# Patient Record
Sex: Female | Born: 1990 | Race: White | Hispanic: No | Marital: Single | Smoking: Current every day smoker
Health system: Southern US, Community
[De-identification: ages and names within clinical notes are randomized; demographics above are authoritative.]

## PROBLEM LIST (undated history)

## (undated) ENCOUNTER — Inpatient Hospital Stay (HOSPITAL_COMMUNITY): Payer: Self-pay

## (undated) DIAGNOSIS — Z8619 Personal history of other infectious and parasitic diseases: Secondary | ICD-10-CM

## (undated) DIAGNOSIS — R569 Unspecified convulsions: Secondary | ICD-10-CM

## (undated) DIAGNOSIS — R011 Cardiac murmur, unspecified: Secondary | ICD-10-CM

## (undated) DIAGNOSIS — N809 Endometriosis, unspecified: Secondary | ICD-10-CM

## (undated) DIAGNOSIS — D649 Anemia, unspecified: Secondary | ICD-10-CM

## (undated) DIAGNOSIS — Z87898 Personal history of other specified conditions: Secondary | ICD-10-CM

## (undated) DIAGNOSIS — K589 Irritable bowel syndrome without diarrhea: Secondary | ICD-10-CM

## (undated) HISTORY — PX: TONSILLECTOMY AND ADENOIDECTOMY: SUR1326

---

## 2008-11-15 ENCOUNTER — Emergency Department (HOSPITAL_COMMUNITY): Admission: EM | Admit: 2008-11-15 | Discharge: 2008-11-16 | Payer: Self-pay | Admitting: Emergency Medicine

## 2008-11-15 IMAGING — US US OB COMP LESS 14 WK
1 series · 14 of 27 positions shown · non-contrast
Comparison: None

CLINICAL DATA: Vaginal bleeding.

OBSTETRIC <14 WK US AND TRANSVAGINAL OB US
TECHNIQUE: Both transabdominal and transvaginal ultrasound
examinations were performed for complete evaluation of the
gestation as well as the maternal uterus, adnexal regions, and
pelvic cul-de-sac.

[Series 1: us ob comp less 14 wk · 0.30mm/px · 14 of 27 slices shown]
[im 1/27]
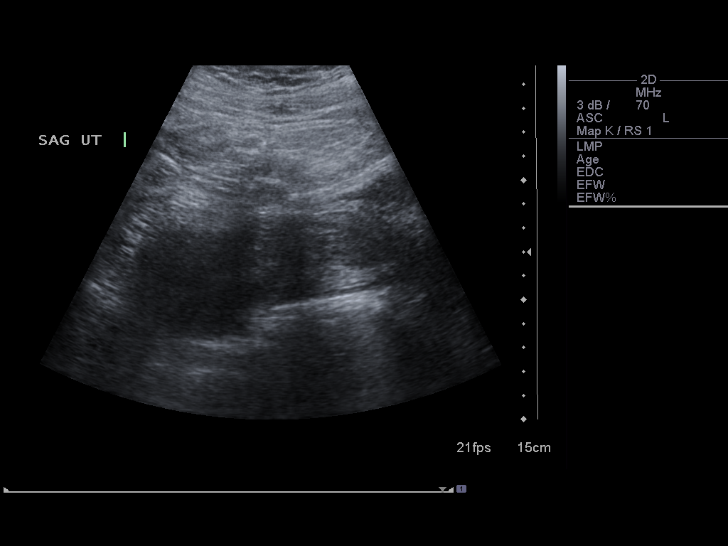
[im 3/27]
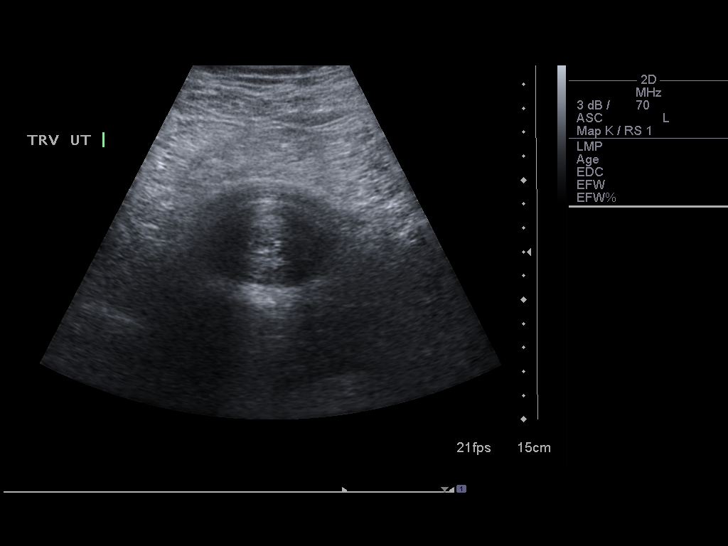
[im 5/27]
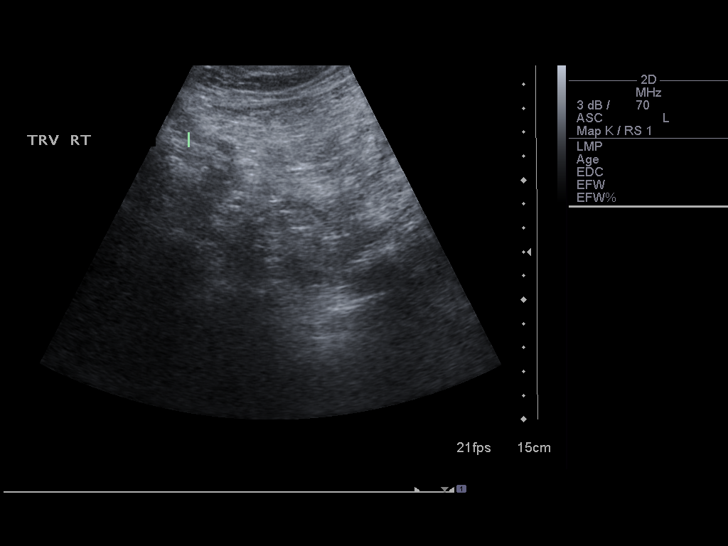
[im 7/27]
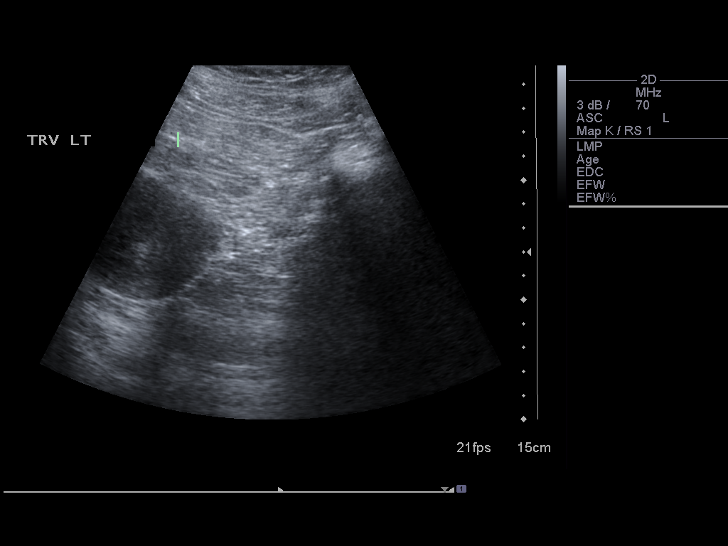
[im 9/27]
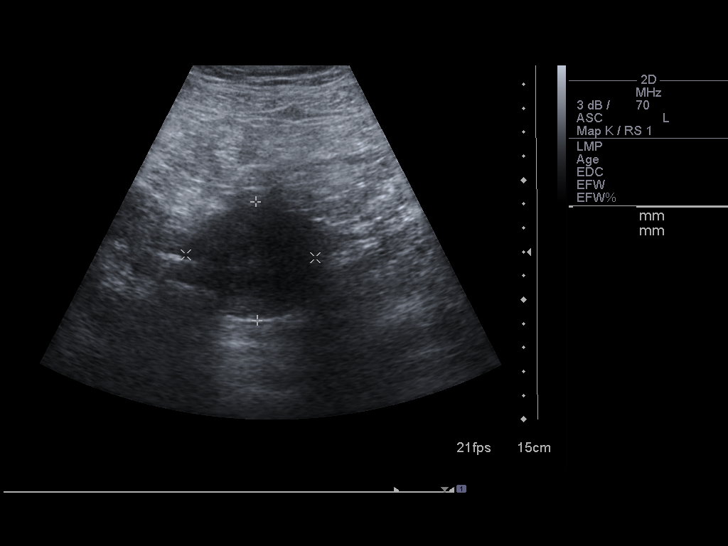
[im 11/27]
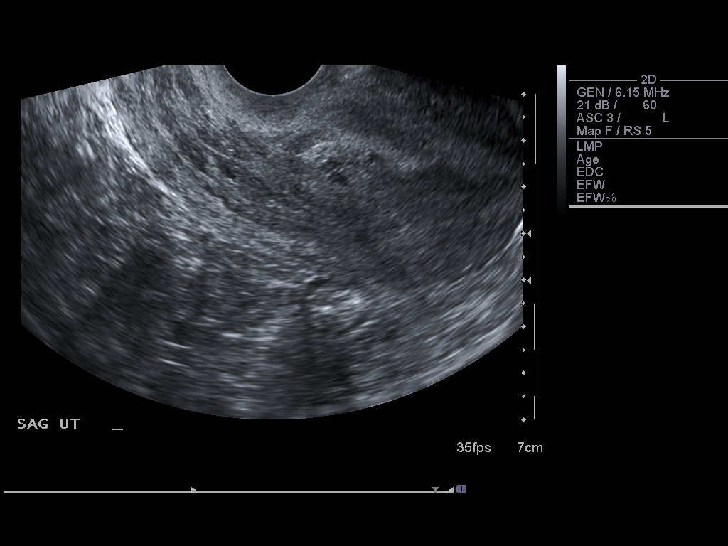
[im 13/27]
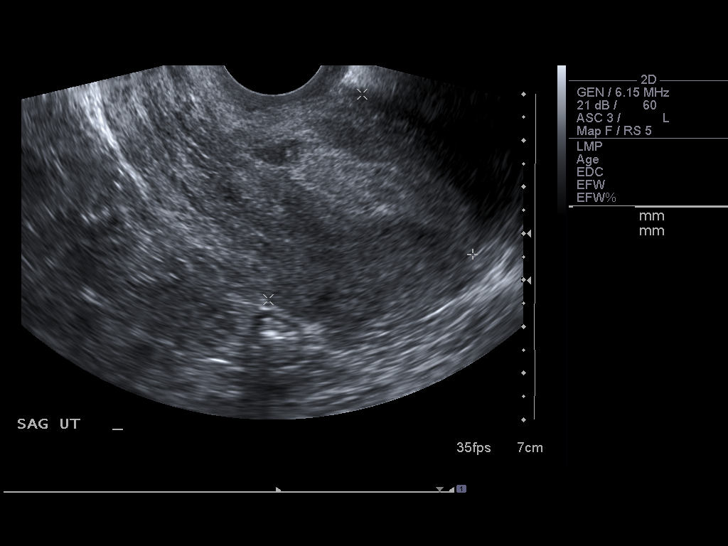
[im 15/27]
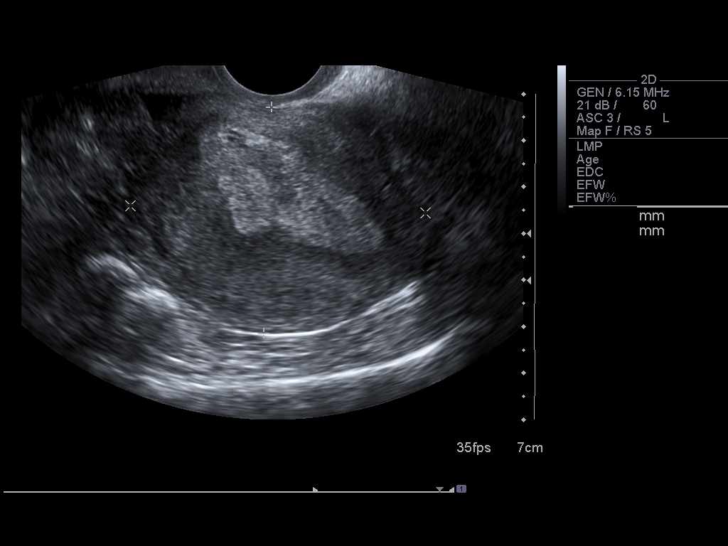
[im 17/27]
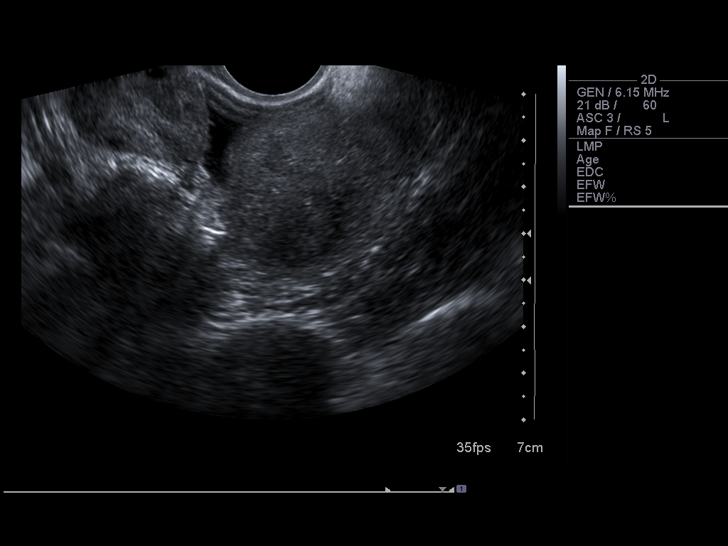
[im 19/27]
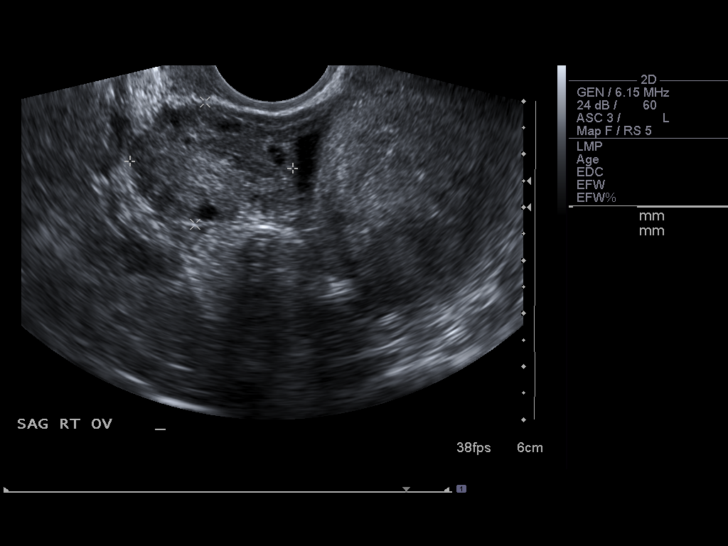
[im 21/27]
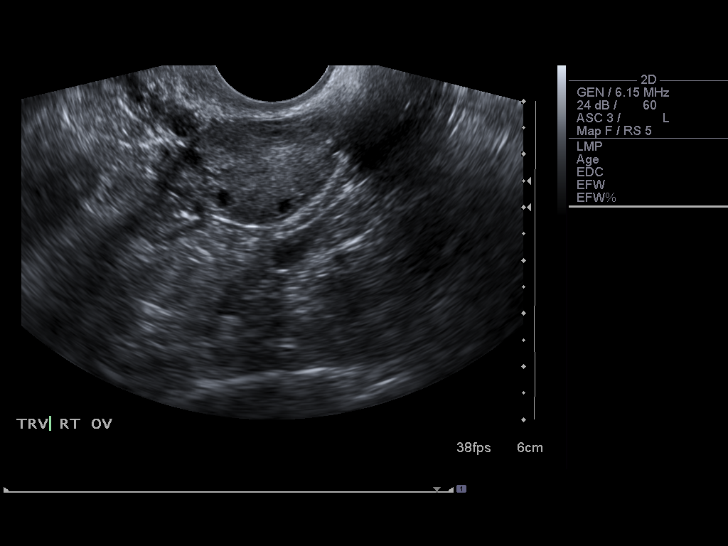
[im 23/27]
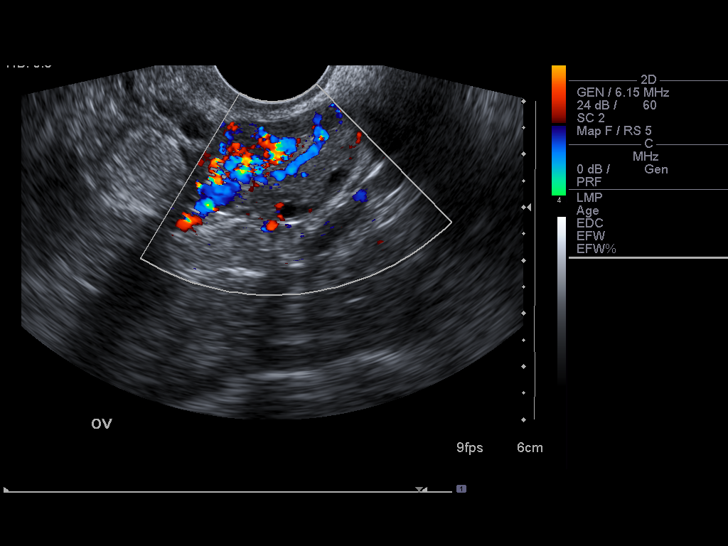
[im 25/27]
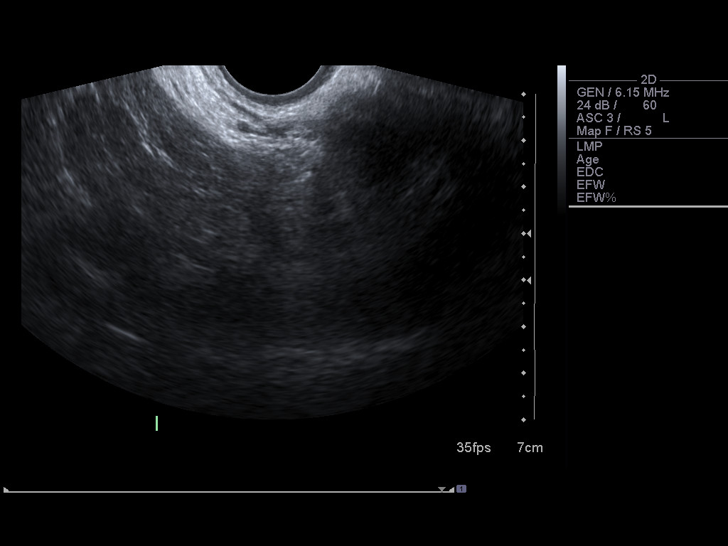
[im 27/27]
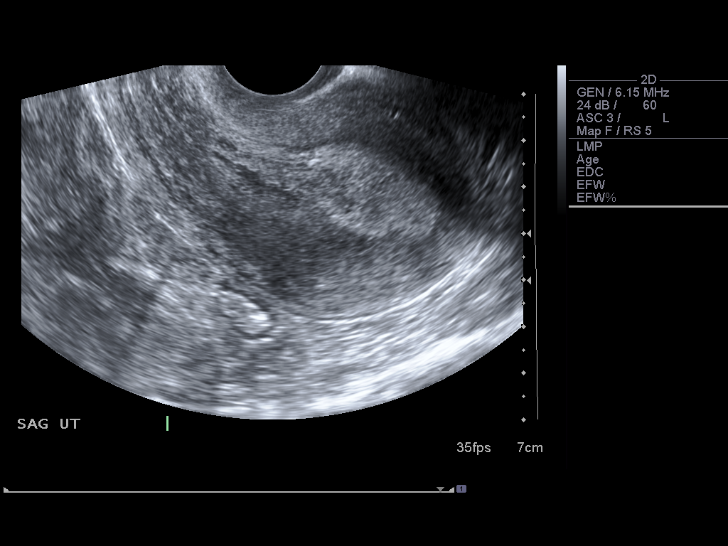

[14 of 27 positions shown; findings below may reference images not displayed]

Intrauterine gestational sac: None
Yolk sac: None
Embryo: None
Cardiac Activity: None
Heart Rate:  bpm

MSD:   mm      w     d
CRL:               w     d          US EDC:

Maternal uterus/adnexae:

The patient is actively bleeding.  Uterus measures 11.3 x 5.4 x
cm.  Echogenic thick endometrium.  No free fluid.

The ovaries are normal bilaterally.
IMPRESSION: 1.  There is no intrauterine pregnancy identified.  The patient is
actively bleeding consistent with a spontaneous AB.
2.  The ovaries are normal.

## 2008-11-16 ENCOUNTER — Encounter: Payer: Self-pay | Admitting: Obstetrics and Gynecology

## 2008-11-16 ENCOUNTER — Inpatient Hospital Stay (HOSPITAL_COMMUNITY): Admission: AD | Admit: 2008-11-16 | Discharge: 2008-11-16 | Payer: Self-pay | Admitting: Obstetrics & Gynecology

## 2008-12-08 ENCOUNTER — Encounter: Payer: Self-pay | Admitting: Advanced Practice Midwife

## 2008-12-08 ENCOUNTER — Ambulatory Visit: Payer: Self-pay | Admitting: Obstetrics & Gynecology

## 2008-12-13 ENCOUNTER — Ambulatory Visit: Payer: Self-pay | Admitting: Obstetrics & Gynecology

## 2008-12-13 ENCOUNTER — Encounter: Payer: Self-pay | Admitting: Obstetrics & Gynecology

## 2008-12-13 LAB — CONVERTED CEMR LAB: hCG, Beta Chain, Quant, S: 11.5 milliintl units/mL

## 2008-12-21 ENCOUNTER — Encounter: Payer: Self-pay | Admitting: Obstetrics & Gynecology

## 2008-12-21 ENCOUNTER — Ambulatory Visit: Payer: Self-pay | Admitting: Obstetrics & Gynecology

## 2008-12-28 ENCOUNTER — Encounter: Payer: Self-pay | Admitting: Obstetrics and Gynecology

## 2008-12-28 ENCOUNTER — Ambulatory Visit: Payer: Self-pay | Admitting: Obstetrics & Gynecology

## 2008-12-28 LAB — CONVERTED CEMR LAB: hCG, Beta Chain, Quant, S: 6 milliintl units/mL

## 2009-03-13 ENCOUNTER — Emergency Department (HOSPITAL_COMMUNITY): Admission: EM | Admit: 2009-03-13 | Discharge: 2009-03-14 | Payer: Self-pay | Admitting: Emergency Medicine

## 2009-03-13 IMAGING — CR DG CHEST 2V
2 series · 2 of 2 positions shown · non-contrast
Comparison: None.

CLINICAL DATA: 18-year-old female with shortness of breath, fever,
congestion.

CHEST - 2 VIEW

[w chest pa]
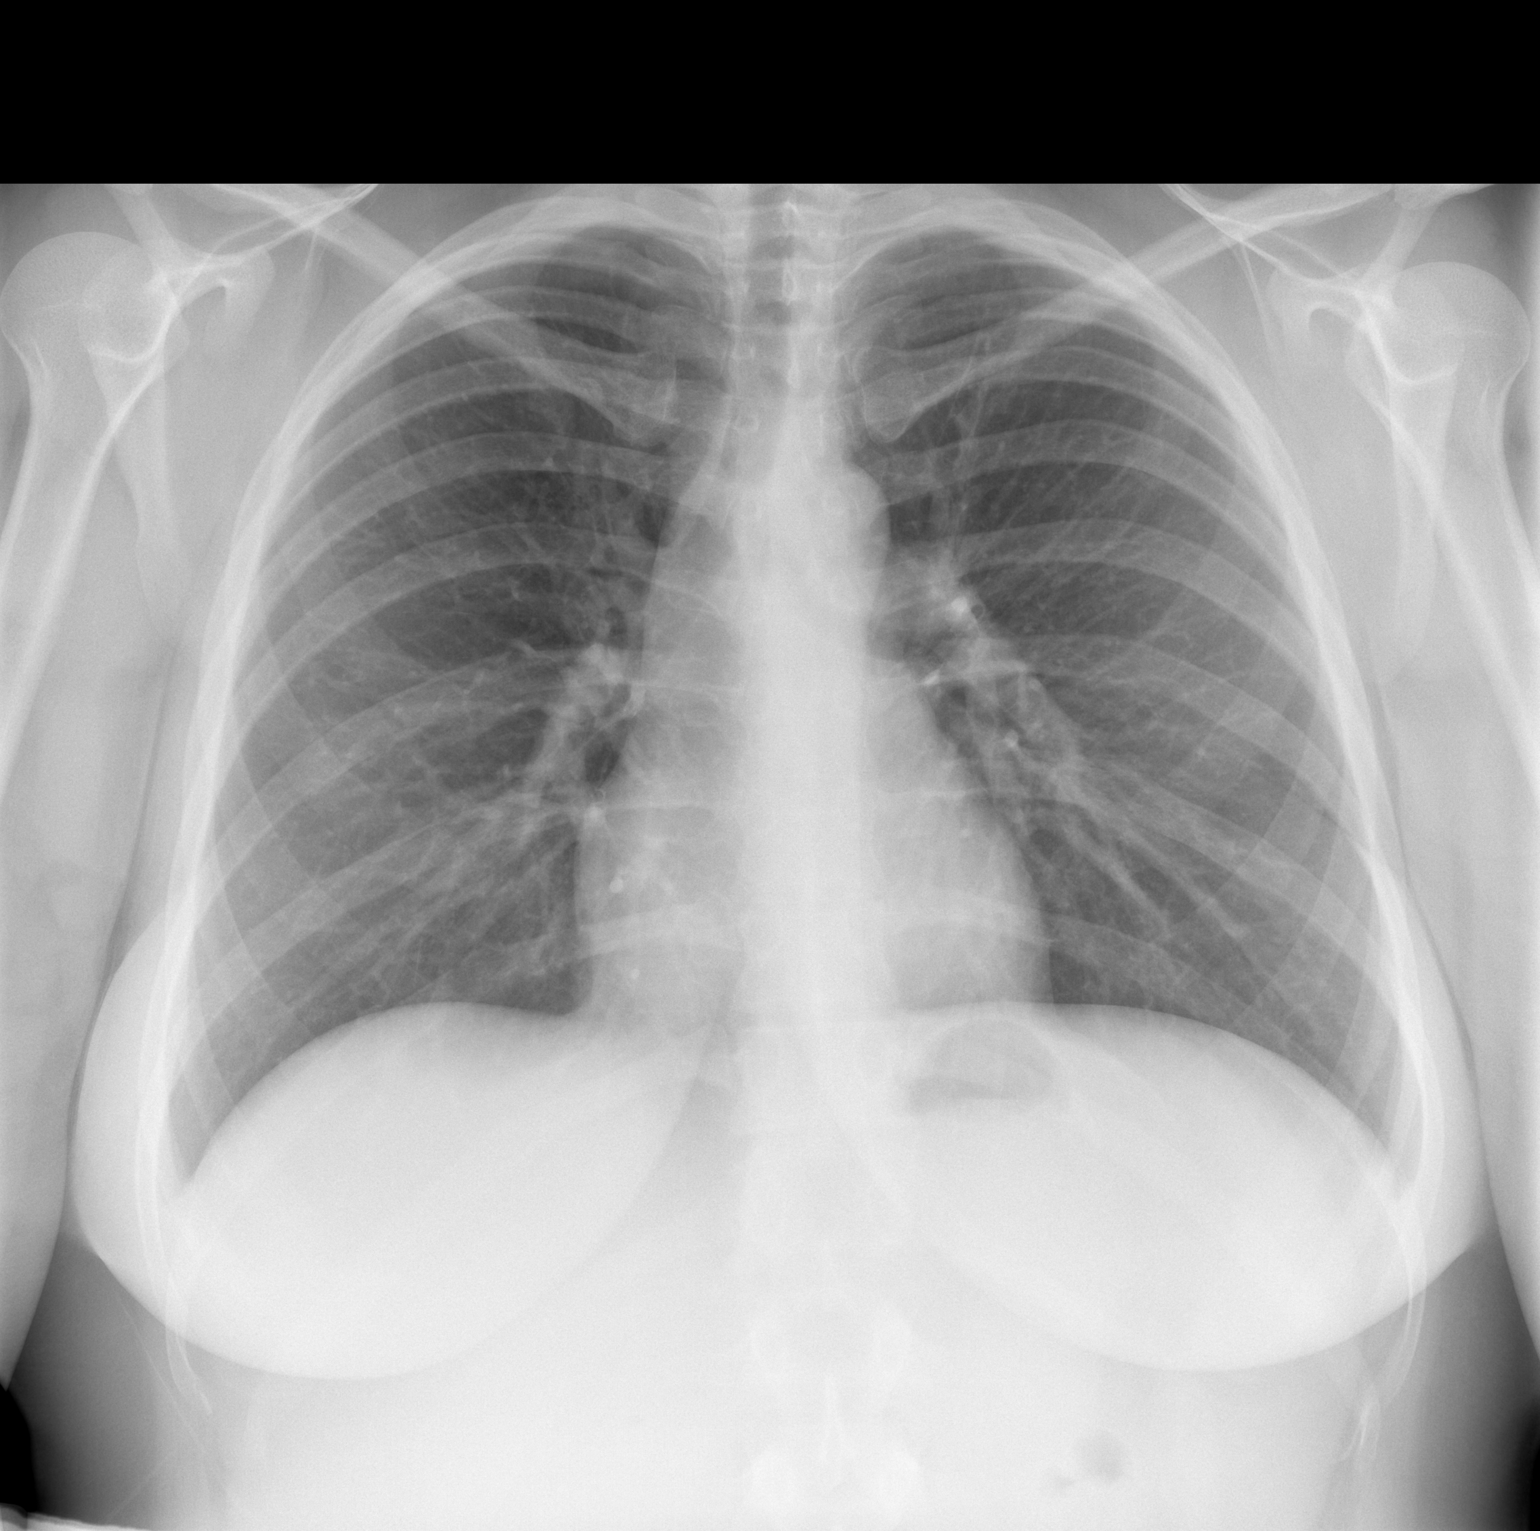

[w chest lat]
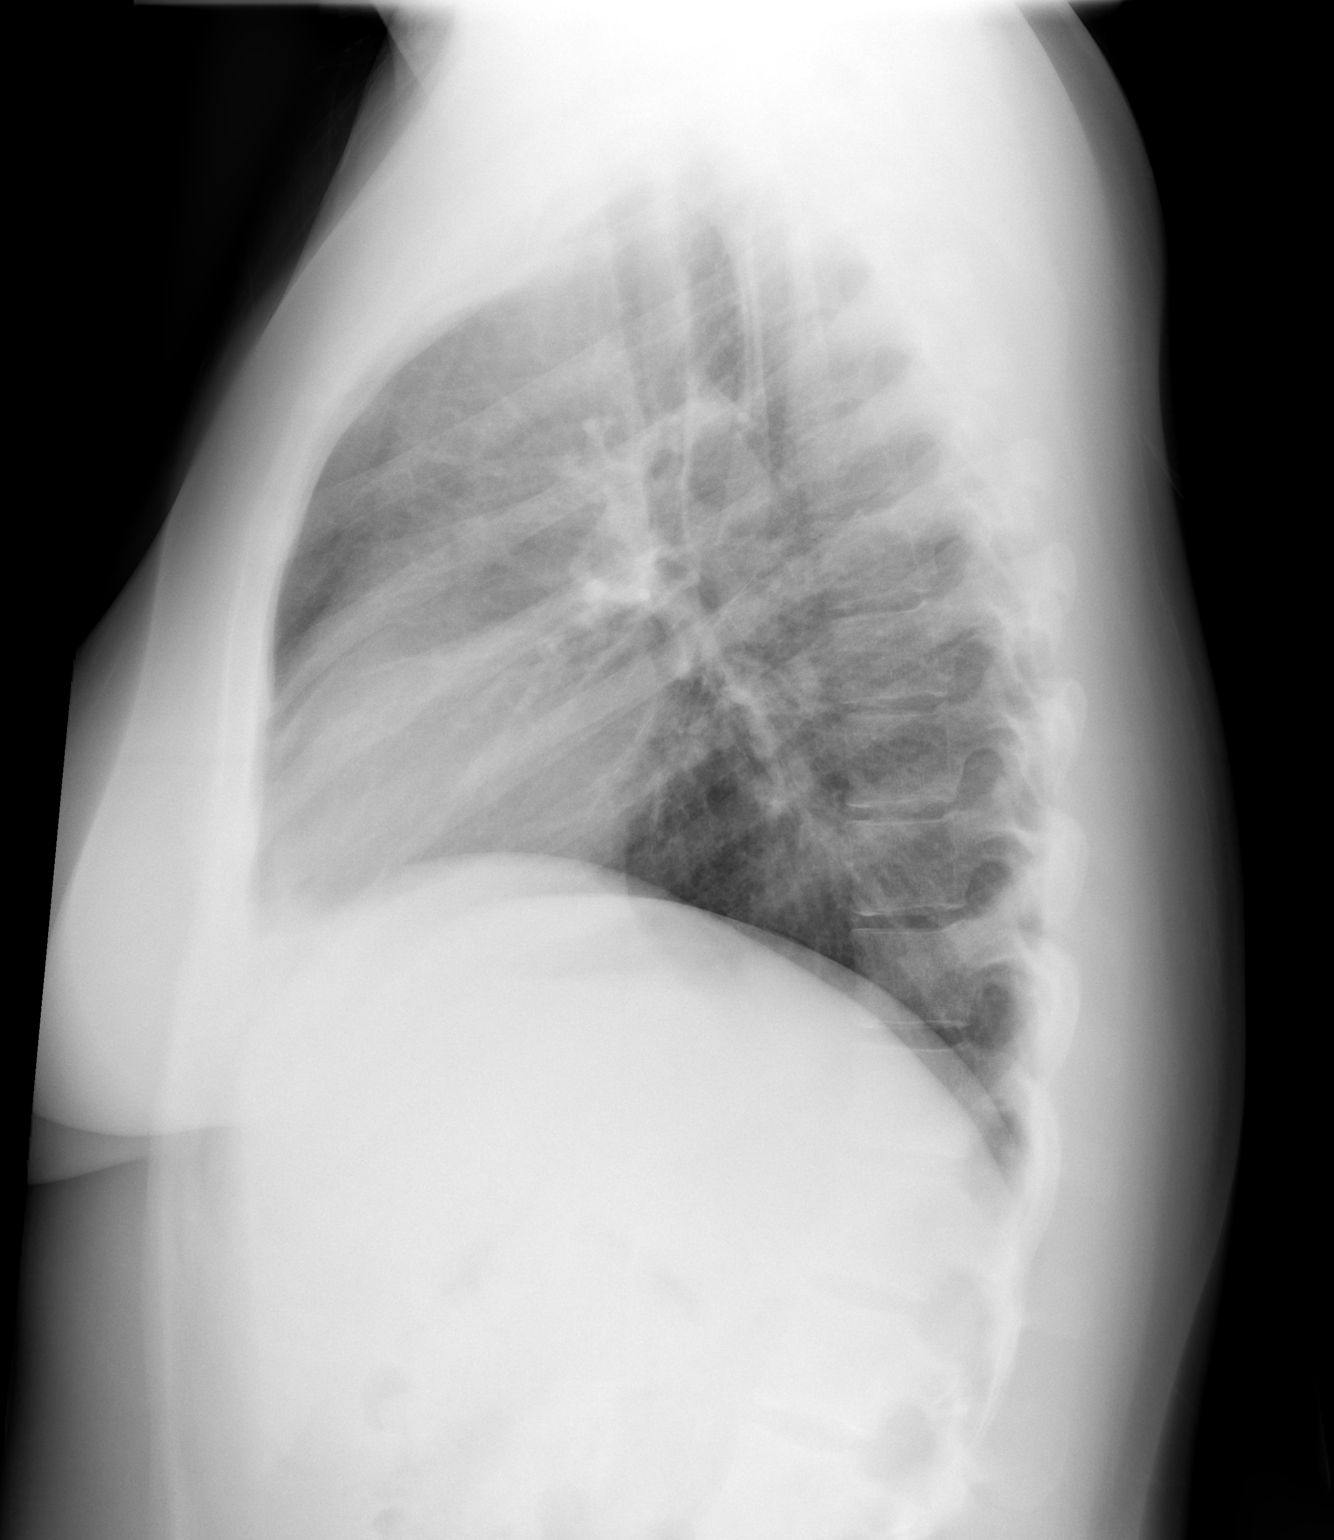

[2 of 2 positions shown; findings below may reference images not displayed]

FINDINGS: Lung volumes within normal limits.  Cardiac size and
mediastinal contours are within normal limits.  Visualized tracheal
air column is within normal limits.  No pneumothorax, pulmonary
edema, pleural effusion, consolidation, or confluent airspace
opacity. No acute osseous abnormality identified.
IMPRESSION: No acute cardiopulmonary abnormality.

## 2009-07-19 ENCOUNTER — Emergency Department (HOSPITAL_COMMUNITY): Admission: EM | Admit: 2009-07-19 | Discharge: 2009-07-19 | Payer: Self-pay | Admitting: Emergency Medicine

## 2009-07-19 IMAGING — CR DG CHEST 2V
2 series · 2 of 2 positions shown · non-contrast
Comparison: [DATE]

CLINICAL DATA: Cough and up blood.  Shortness of breath.  Chest
pain.

CHEST - 2 VIEW

[view not recorded (1 of 2)]
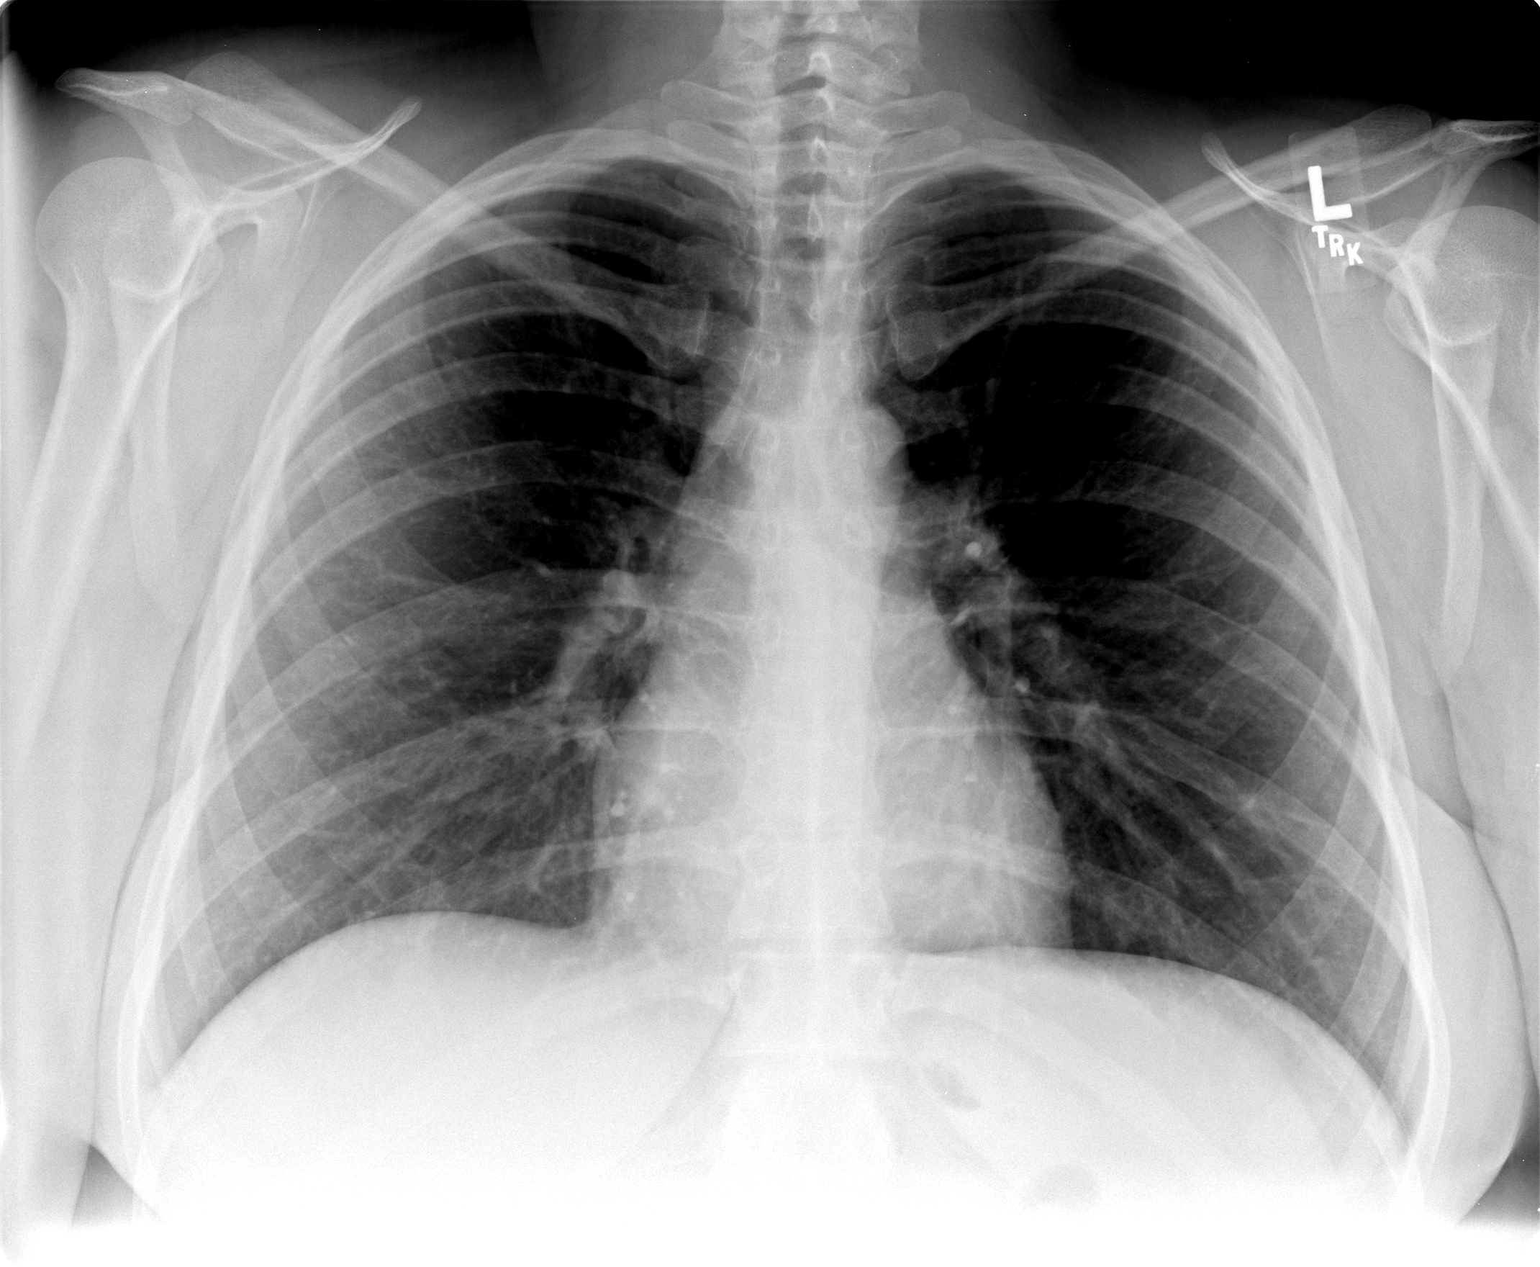

[view not recorded (2 of 2)]
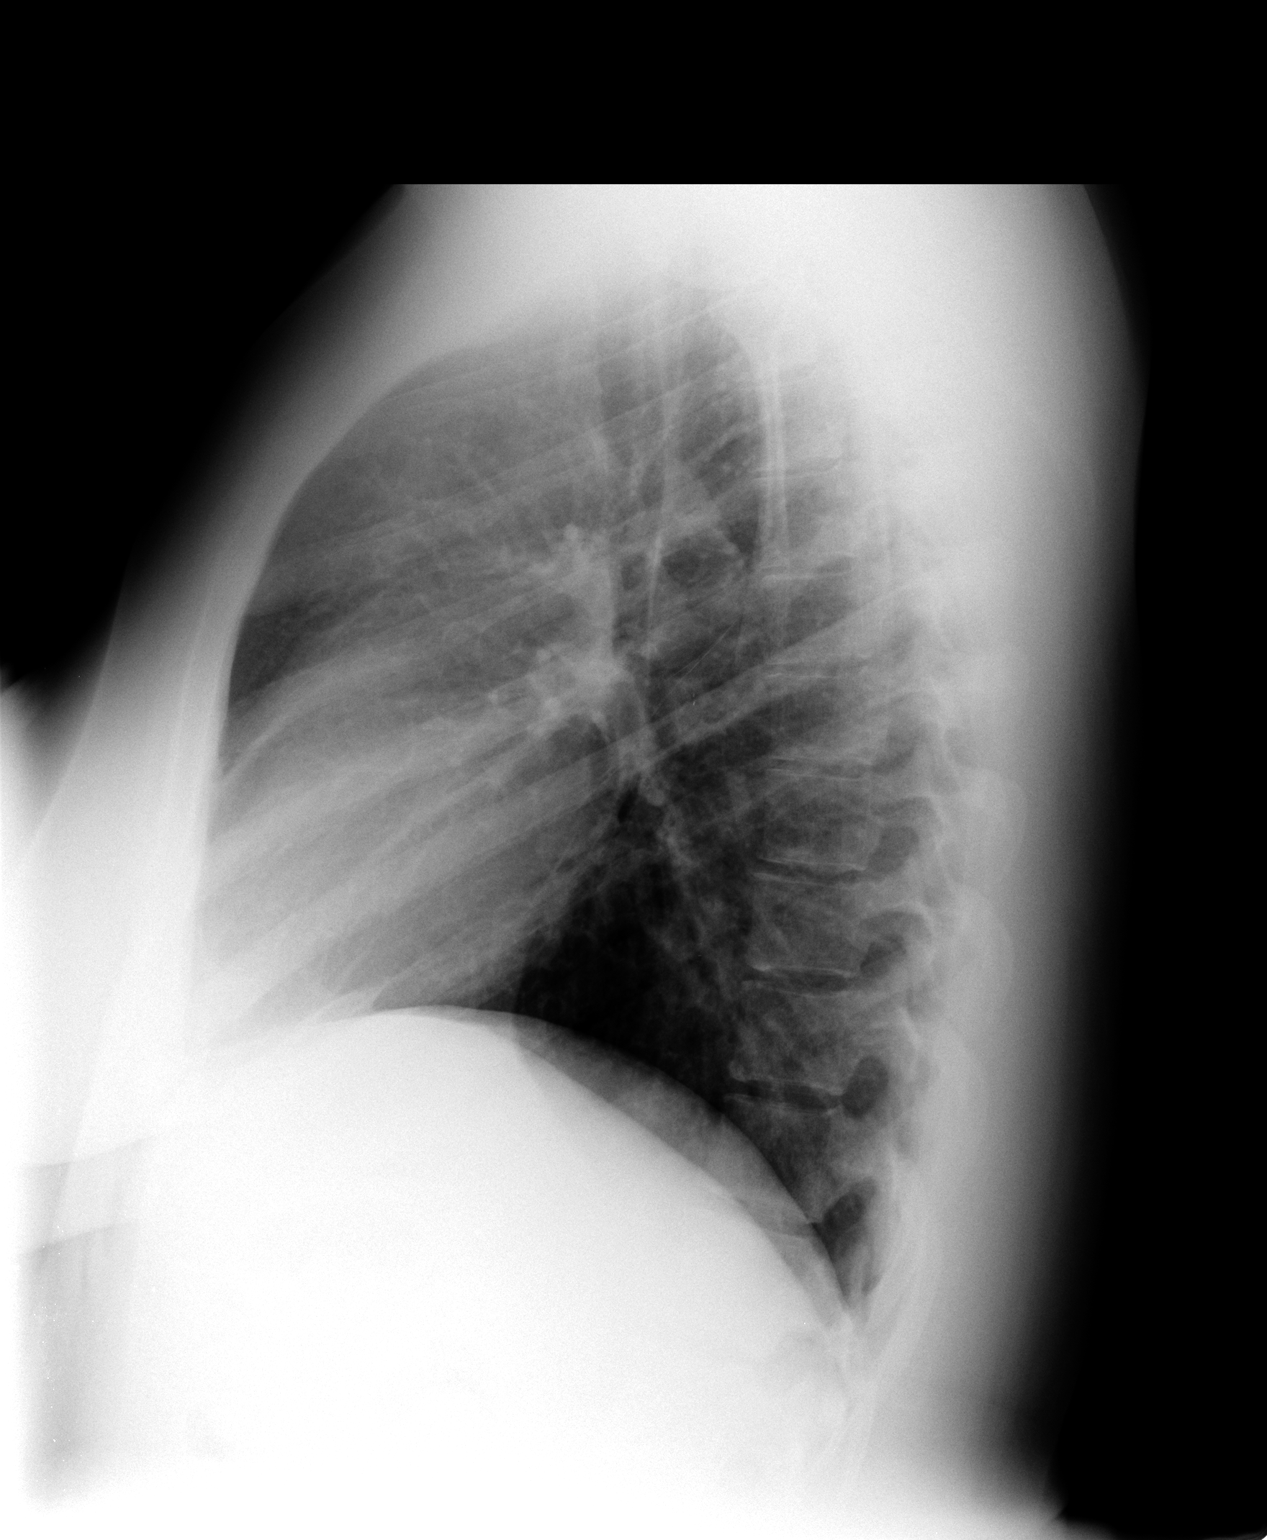

[2 of 2 positions shown; findings below may reference images not displayed]

FINDINGS: Trachea is midline.  Heart size normal.  Lungs are clear.
No pleural fluid.
IMPRESSION: No acute findings.

## 2009-10-04 ENCOUNTER — Emergency Department (HOSPITAL_COMMUNITY): Admission: EM | Admit: 2009-10-04 | Discharge: 2009-10-04 | Payer: Self-pay | Admitting: Emergency Medicine

## 2010-09-24 LAB — POCT I-STAT, CHEM 8
BUN: 6 mg/dL (ref 6–23)
Creatinine, Ser: 0.8 mg/dL (ref 0.4–1.2)
Potassium: 3.6 mEq/L (ref 3.5–5.1)

## 2010-09-24 LAB — CBC
HCT: 35.8 % — ABNORMAL LOW (ref 36.0–46.0)
MCHC: 35.2 g/dL (ref 30.0–36.0)
Platelets: 265 10*3/uL (ref 150–400)
RDW: 13 % (ref 11.5–15.5)

## 2010-09-24 LAB — HCG, QUANTITATIVE, PREGNANCY: hCG, Beta Chain, Quant, S: 31749 m[IU]/mL — ABNORMAL HIGH (ref <5)

## 2010-09-24 LAB — COMPREHENSIVE METABOLIC PANEL
ALT: 24 U/L (ref 0–35)
Albumin: 4 g/dL (ref 3.5–5.2)
Alkaline Phosphatase: 55 U/L (ref 39–117)
BUN: 4 mg/dL — ABNORMAL LOW (ref 6–23)
Chloride: 107 mEq/L (ref 96–112)
GFR calc Af Amer: >60 mL/min (ref >60)
Sodium: 139 mEq/L (ref 135–145)
Total Bilirubin: 0.4 mg/dL (ref 0.3–1.2)

## 2011-05-19 ENCOUNTER — Encounter: Payer: Self-pay | Admitting: Nurse Practitioner

## 2011-05-19 ENCOUNTER — Emergency Department (HOSPITAL_COMMUNITY)
Admission: EM | Admit: 2011-05-19 | Discharge: 2011-05-19 | Disposition: A | Discharge status: Home / Self Care | Payer: Self-pay | Attending: Emergency Medicine | Admitting: Emergency Medicine

## 2011-05-19 ENCOUNTER — Emergency Department (HOSPITAL_COMMUNITY): Payer: Self-pay

## 2011-05-19 DIAGNOSIS — R0989 Other specified symptoms and signs involving the circulatory and respiratory systems: Secondary | ICD-10-CM | POA: Insufficient documentation

## 2011-05-19 DIAGNOSIS — J111 Influenza due to unidentified influenza virus with other respiratory manifestations: Secondary | ICD-10-CM | POA: Insufficient documentation

## 2011-05-19 DIAGNOSIS — J3489 Other specified disorders of nose and nasal sinuses: Secondary | ICD-10-CM | POA: Insufficient documentation

## 2011-05-19 DIAGNOSIS — IMO0001 Reserved for inherently not codable concepts without codable children: Secondary | ICD-10-CM | POA: Insufficient documentation

## 2011-05-19 DIAGNOSIS — R51 Headache: Secondary | ICD-10-CM | POA: Insufficient documentation

## 2011-05-19 DIAGNOSIS — R05 Cough: Secondary | ICD-10-CM | POA: Insufficient documentation

## 2011-05-19 DIAGNOSIS — R059 Cough, unspecified: Secondary | ICD-10-CM | POA: Insufficient documentation

## 2011-05-19 DIAGNOSIS — R509 Fever, unspecified: Secondary | ICD-10-CM | POA: Insufficient documentation

## 2011-05-19 IMAGING — CR DG CHEST 2V
2 series · 2 of 2 positions shown · non-contrast
Comparison: [DATE]

CLINICAL DATA: Cough, congestion, nausea.

CHEST - 2 VIEW

[w chest pa]
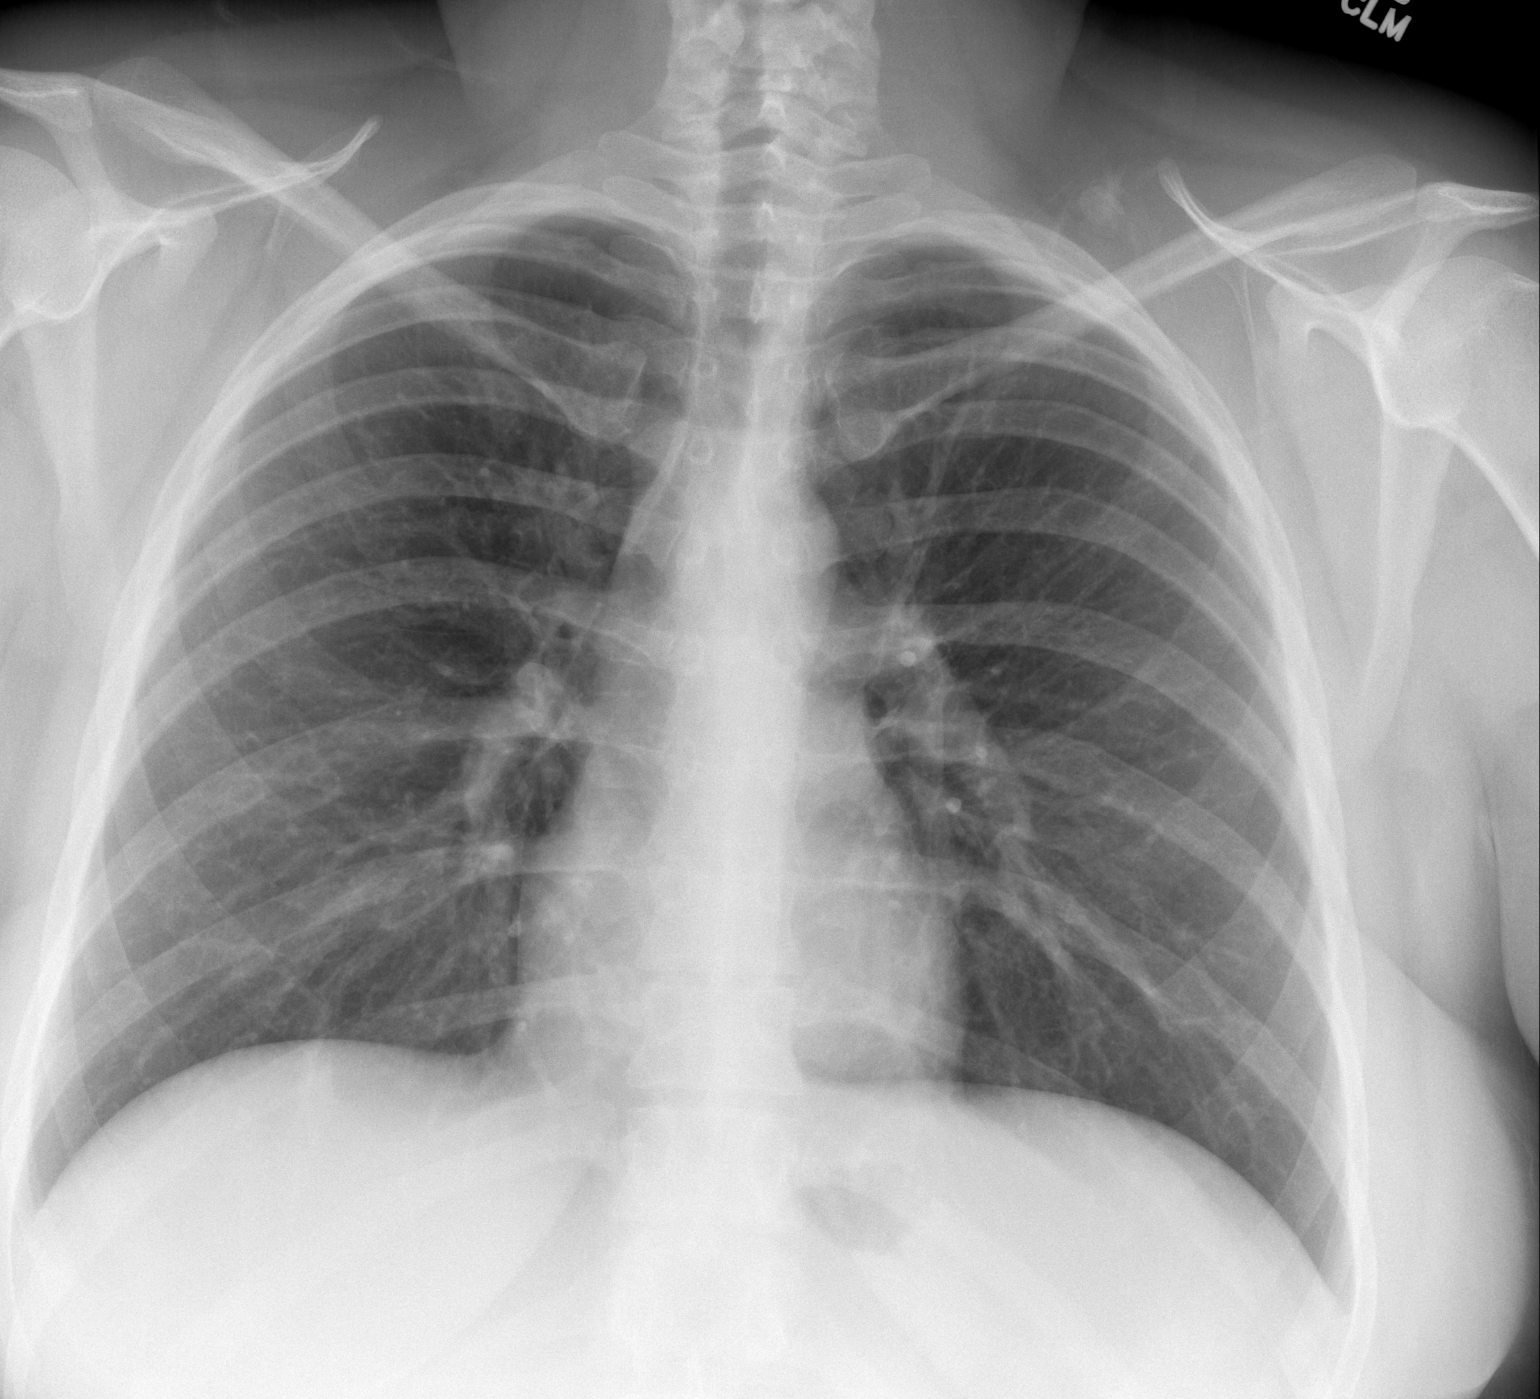

[w chest lat]
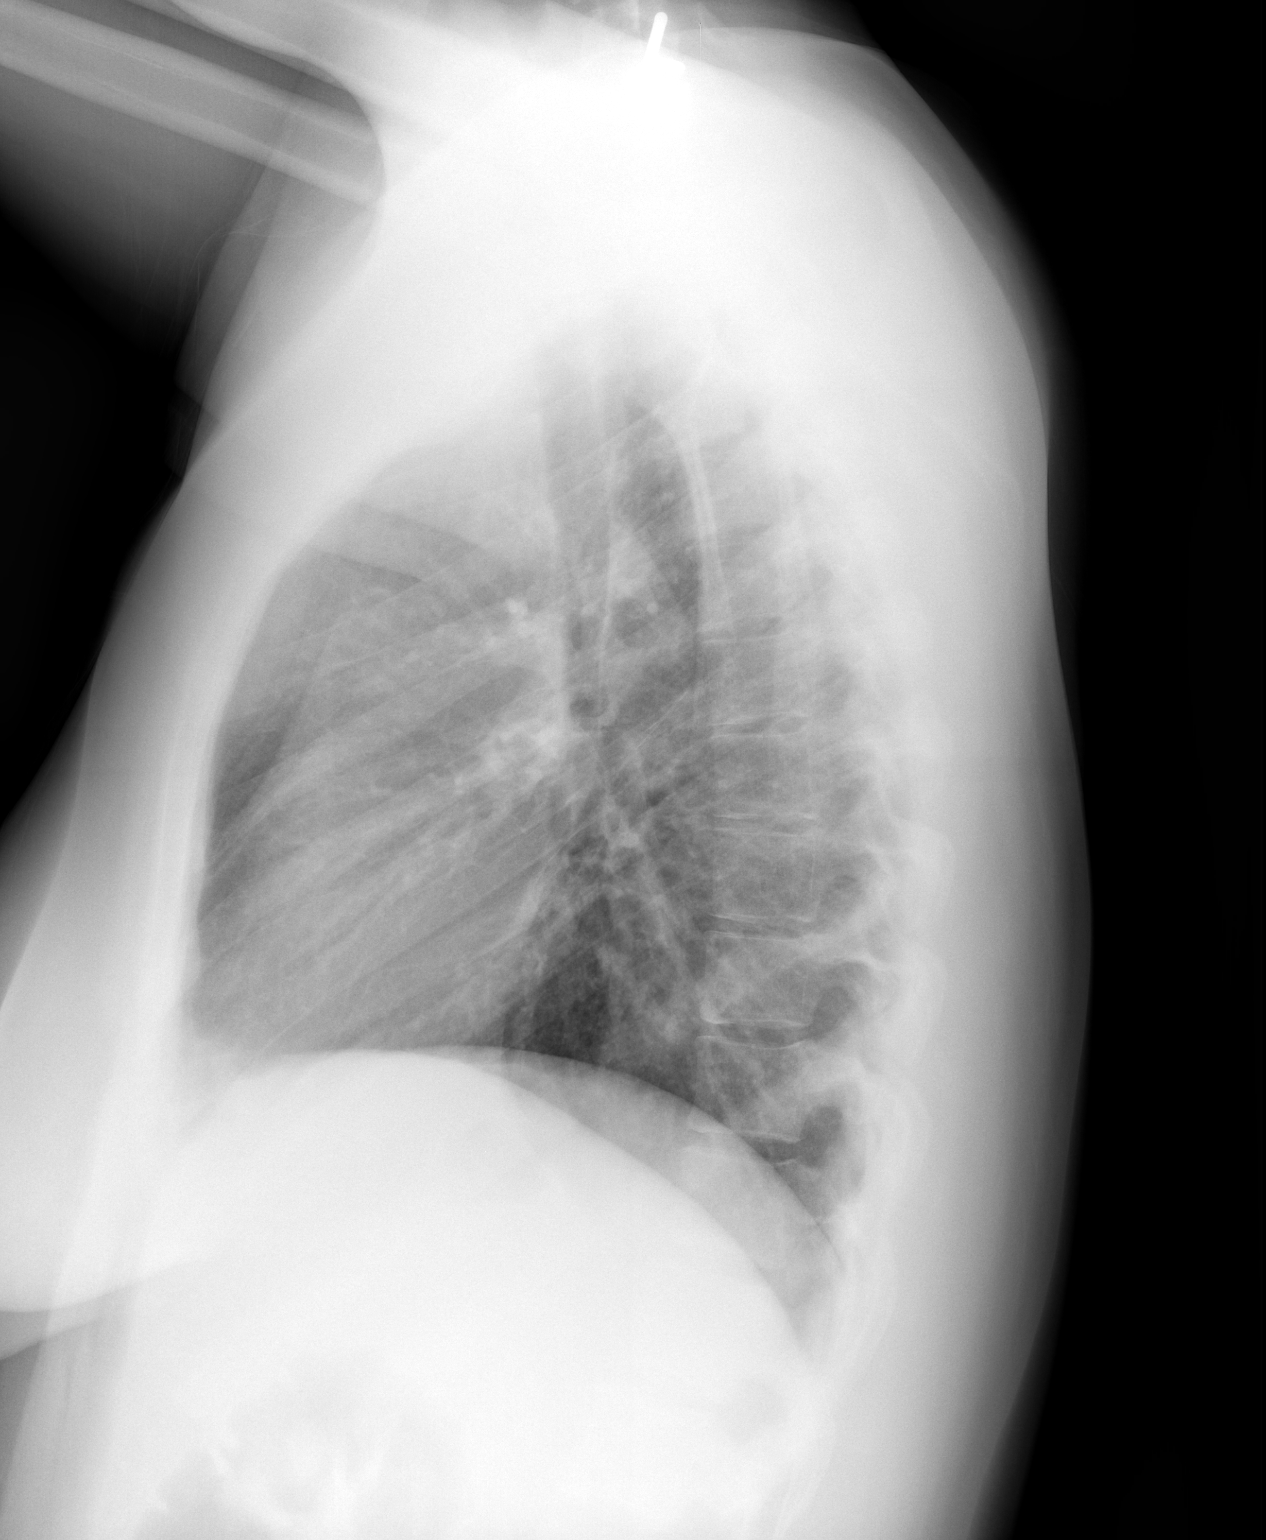

[2 of 2 positions shown; findings below may reference images not displayed]

FINDINGS: Heart and mediastinal contours are within normal limits.
No focal opacities or effusions.  No acute bony abnormality.
IMPRESSION: No active cardiopulmonary disease.

## 2011-05-19 MED ORDER — ACETAMINOPHEN 325 MG PO TABS
650.0000 mg | ORAL_TABLET | Freq: Once | ORAL | Status: AC
  Administered 2011-05-19: 650 mg via ORAL
  Filled 2011-05-19: qty 2

## 2011-05-19 NOTE — ED Provider Notes (Signed)
History     CSN: [id number] Arrival date & time: 05/19/2011  1:01 PM   First MD Initiated Contact with Patient 05/19/11 1512      Chief Complaint  Patient presents with  . Generalized Body Aches    (Consider location/radiation/quality/duration/timing/severity/associated sxs/prior treatment) HPI Comments: Patient was seen  Hospital on Friday and diagnosed with an upper respiratory tract infection. She had continued coughing but states symptoms got much worse with fever, headache, body aches.  Patient is a 20 y.o. female presenting with URI. The history is provided by the patient.  URI The primary symptoms include fever, headaches, cough and myalgias. Primary symptoms do not include wheezing, abdominal pain or rash. Episode onset: Symptoms have been going on for about one week but today got much worse. This is a new problem. The problem has been rapidly worsening.  The fever began today. The fever has been unchanged since its onset. The maximum temperature recorded prior to her arrival was unknown.  The cough began more than 1 week ago. The cough is non-productive.  The onset of the illness is associated with exposure to sick contacts. Symptoms associated with the illness include chills, sinus pressure, congestion and rhinorrhea. The following treatments were addressed: Acetaminophen was not tried. NSAIDs were ineffective.    History reviewed. No pertinent past medical history.  History reviewed. No pertinent past surgical history.  History reviewed. No pertinent family history.  History  Substance Use Topics  . Smoking status: Never Smoker   . Smokeless tobacco: Not on file  . Alcohol Use: Yes     rare    OB History    Grav Para Term Preterm Abortions TAB SAB Ect Mult Living                  Review of Systems  Constitutional: Positive for fever and chills.  HENT: Positive for congestion, rhinorrhea and sinus pressure.   Respiratory: Positive for cough. Negative for  wheezing.   Gastrointestinal: Negative for abdominal pain.  Musculoskeletal: Positive for myalgias.  Skin: Negative for rash.  Neurological: Positive for headaches.  All other systems reviewed and are negative.    Allergies  Review of patient's allergies indicates no known allergies.  Home Medications   Current Outpatient Rx  Name Route Sig Dispense Refill  . IBUPROFEN 200 MG PO TABS Oral Take 200 mg by mouth every 6 (six) hours as needed. For pain.     Marland Kitchen OVER THE COUNTER MEDICATION Oral Take 2 tablets by mouth daily as needed. For  Cough.       BP 118/78  Pulse 94  Temp(Src) 98.4 F (36.9 C) (Oral)  Resp 20  Ht 5\' 8"  (1.727 m)  Wt 230 lb (104.327 kg)  BMI 34.97 kg/m2  SpO2 99%  Physical Exam  Nursing note and vitals reviewed. Constitutional: She is oriented to person, place, and time. She appears well-developed and well-nourished. She appears distressed.  HENT:  Head: Normocephalic and atraumatic.  Right Ear: Tympanic membrane and ear canal normal.  Left Ear: Tympanic membrane and ear canal normal.  Mouth/Throat: Oropharynx is clear and moist. No oropharyngeal exudate.  Eyes: EOM are normal. Pupils are equal, round, and reactive to light.  Neck: Normal range of motion. Neck supple.  Cardiovascular: Normal rate, regular rhythm, normal heart sounds and intact distal pulses.  Exam reveals no friction rub.   No murmur heard. Pulmonary/Chest: Effort normal. She has no wheezes. She has rhonchi in the right middle field and the right  lower field. She has no rales.  Abdominal: Soft. Bowel sounds are normal. She exhibits no distension. There is no tenderness. There is no rebound and no guarding.  Musculoskeletal: Normal range of motion. She exhibits tenderness.       Tenderness on palpation of major muscle groups in the arms and legs  Lymphadenopathy:    She has no cervical adenopathy.  Neurological: She is alert and oriented to person, place, and time. No cranial nerve  deficit.  Skin: Skin is warm and dry. No rash noted.  Psychiatric: She has a normal mood and affect. Her behavior is normal.    ED Course  Procedures (including critical care time)  Labs Reviewed - No data to display Dg Chest 2 View  05/19/2011  *RADIOLOGY REPORT*  Clinical Data: Cough, congestion, nausea.  CHEST - 2 VIEW  Comparison: 07/19/2009  Findings: Heart and mediastinal contours are within normal limits. No focal opacities or effusions.  No acute bony abnormality.  IMPRESSION: No active cardiopulmonary disease.  Original Report Authenticated By: Cyndie Chime, M.D.     No diagnosis found.    MDM   Pt with symptoms consistent with URI vs flu.  Well appearing here.  No signs of breathing difficulty.  No signs of pharyngitis, otitis or abnormal abdominal findings.  Patient seen and landed off on Friday and told she had a viral infection, however if symptoms are worsening now with fever and rhonchi on the right side. Will reevaluate with a chest x-ray. CXR wnl and pt to return with any further problems.  5:14 PM Chest x-ray negative for acute pathology. Most likely URI versus flu will discharge home.      Gwyneth Sprout, MD 05/19/11 1714

## 2011-05-19 NOTE — ED Notes (Signed)
Family at bedside. 

## 2011-05-19 NOTE — ED Notes (Signed)
Patient is resting comfortably. 

## 2011-05-19 NOTE — ED Notes (Signed)
C/o body aches, headaches, not feeling well onset today. A&Ox4, resp e/u

## 2012-11-26 LAB — OB RESULTS CONSOLE HIV ANTIBODY (ROUTINE TESTING): HIV: NONREACTIVE

## 2013-11-26 LAB — OB RESULTS CONSOLE GC/CHLAMYDIA
Chlamydia: NEGATIVE
GC PROBE AMP, GENITAL: NEGATIVE

## 2013-11-26 LAB — OB RESULTS CONSOLE HEPATITIS B SURFACE ANTIGEN: HEP B S AG: NEGATIVE

## 2013-11-26 LAB — OB RESULTS CONSOLE RUBELLA ANTIBODY, IGM: RUBELLA: IMMUNE

## 2013-11-26 LAB — OB RESULTS CONSOLE RPR: RPR: NONREACTIVE

## 2013-11-26 LAB — OB RESULTS CONSOLE HIV ANTIBODY (ROUTINE TESTING): HIV: NONREACTIVE

## 2013-11-26 LAB — OB RESULTS CONSOLE ANTIBODY SCREEN: ANTIBODY SCREEN: NEGATIVE

## 2013-11-26 LAB — OB RESULTS CONSOLE ABO/RH: RH Type: POSITIVE

## 2014-02-26 ENCOUNTER — Encounter (HOSPITAL_COMMUNITY): Payer: Self-pay | Admitting: *Deleted

## 2014-02-26 ENCOUNTER — Inpatient Hospital Stay (HOSPITAL_COMMUNITY)
Admission: AD | Admit: 2014-02-26 | Discharge: 2014-02-26 | Disposition: A | Discharge status: Home / Self Care | Payer: Medicaid Other | Source: Ambulatory Visit | Attending: Obstetrics and Gynecology | Admitting: Obstetrics and Gynecology

## 2014-02-26 DIAGNOSIS — O4410 Placenta previa with hemorrhage, unspecified trimester: Secondary | ICD-10-CM | POA: Diagnosis not present

## 2014-02-26 DIAGNOSIS — O368190 Decreased fetal movements, unspecified trimester, not applicable or unspecified: Secondary | ICD-10-CM | POA: Diagnosis not present

## 2014-02-26 NOTE — MAU Note (Addendum)
Gerrit Heck CNM in Triage to talk with pt regarding decreased FM and plan of care. Pt d/c home from Triage with d/c instructions from CNM.

## 2014-02-26 NOTE — MAU Note (Signed)
I have not felt FM since about 2100 Friday. Complete previa. No bleeding and no d/c

## 2014-02-26 NOTE — Discharge Instructions (Signed)
Placenta Previa   Placenta previa is a condition in pregnant women where the placenta implants in the lower part of the uterus. The placenta either partially or completely covers the opening to the cervix. This is a problem because the baby must pass through the cervix during delivery. There are three types of placenta previa. They include:   1. Marginal placenta previa. The placenta is near the cervix, but does not cover the opening.  2. Partial placenta previa. The placenta covers part of the cervical opening.  3. Complete placenta previa. The placenta covers the entire cervical opening.    Depending on the type of placenta previa, there is a chance the placenta may move into a normal position and no longer cover the cervix as the pregnancy progresses. It is important to keep all prenatal visits with your caregiver.   RISK FACTORS  You may be more likely to develop placenta previa if you:   · Are carrying more than one baby (multiples).    · Have an abnormally shaped uterus.    · Have scars on the lining of the uterus.    · Had previous surgeries involving the uterus, such as a cesarean delivery.    · Have delivered a baby previously.    · Have a history of placenta previa.    · Have smoked or used cocaine during pregnancy.    · Are age 35 or older during pregnancy.    SYMPTOMS  The main symptom of placenta previa is sudden, painless vaginal bleeding during the second half of pregnancy. The amount of bleeding can be light to very heavy. The bleeding may stop on its own, but almost always returns. Cramping, regular contractions, abdominal pain, and lower back pain can also occur with placenta previa.   DIAGNOSIS  Placenta previa can be diagnosed through an ultrasound by finding where the placenta is located. The ultrasound may find placenta previa either during a routine prenatal visit or after vaginal bleeding is noticed. If you are diagnosed with placenta previa, your caregiver may avoid vaginal exams to reduce  the risk of heavy bleeding. There is a chance that placenta previa may not be diagnosed until bleeding occurs during labor.   TREATMENT  Specific treatment depends on:   · How much you are bleeding or if the bleeding has stopped.  · How far along you are in your pregnancy.    · The condition of the baby.    · The location of the baby and placenta.    · The type of placenta previa.    Depending on the factors above, your caregiver may recommend:   · Decreased activity.    · Bed rest at home or in the hospital.  · Pelvic rest. This means no sex, using tampons, douching, pelvic exams, or placing anything into the vagina.  · A blood transfusion to replace maternal blood loss.  · A cesarean delivery if the bleeding is heavy and cannot be controlled or the placenta completely covers the cervix.  · Medication to stop premature labor or mature the fetal lungs if delivery is needed before the pregnancy is full term.    WHEN SHOULD YOU SEEK IMMEDIATE MEDICAL CARE IF YOU ARE SENT HOME WITH PLACENTA PREVIA?  Seek immediate medical care if you show any symptoms of placenta previa. You will need to go to the hospital to get checked immediately. Again, those symptoms are:  · Sudden, painless vaginal bleeding, even a small amount.  · Cramping or regular contractions.  · Lower back or abdominal pain.  Document Released: 06/03/2005   Document Revised: 02/03/2013 Document Reviewed: 09/04/2012  Exit[employer]® Patient Information ©2015 [employer]. This information is not intended to replace advice given to you by your health care provider. Make sure you discuss any questions you have with your health care provider.

## 2014-02-26 NOTE — MAU Provider Note (Signed)
  History   Decreased fetal movement.  Complete placenta previa  CSN: [id number]  Arrival date and time: 02/26/14 2129   None     Chief Complaint  Patient presents with  . Decreased Fetal Movement   HPI Comments: Patient is a 23y.o. G2P0 at 22.1wks who presents with complaint of decreased fetal movement.  Patient called provider and stated that she hadn't felt baby move since yesterday night.  Patient instructed to come in for her comfort and reassurance.  Nurse able to doppler FHTs without incident.     OB History   Grav Para Term Preterm Abortions TAB SAB Ect Mult Living   1               No past medical history on file.  No past surgical history on file.  No family history on file.  History  Substance Use Topics  . Smoking status: Never Smoker   . Smokeless tobacco: Not on file  . Alcohol Use: Yes     Comment: rare    Allergies: No Known Allergies  Prescriptions prior to admission  Medication Sig Dispense Refill  . ibuprofen (ADVIL,MOTRIN) 200 MG tablet Take 200 mg by mouth every 6 (six) hours as needed. For pain.       Marland Kitchen OVER THE COUNTER MEDICATION Take 2 tablets by mouth daily as needed. For  Cough.         Review of Systems  All other systems reviewed and are negative.  Physical Exam   Blood pressure 124/75, pulse 88, temperature 99 F (37.2 C), resp. rate 18, height  (1.727 m), weight 224 lb 6.4 oz (101.787 kg).  Physical Exam  MAU Course  Procedures  MDM FHR: 155 by doppler  Assessment and Plan  IUP at 22.1wks Decreased FM  Plan FHT successfully dopplered Discussed fetal movement in early pregnancy Bleeding and PTL precautions discussed Keep office appt as scheduled: 03/21/2014 Call if you have any questions or concerns prior to your next visit.   Equan Cogbill LYNN 02/26/2014, 10:00 PM

## 2014-04-18 ENCOUNTER — Encounter (HOSPITAL_COMMUNITY): Payer: Self-pay | Admitting: *Deleted

## 2014-05-15 ENCOUNTER — Encounter (HOSPITAL_COMMUNITY): Payer: Self-pay | Admitting: *Deleted

## 2014-05-15 ENCOUNTER — Inpatient Hospital Stay (HOSPITAL_COMMUNITY)
Admission: AD | Admit: 2014-05-15 | Discharge: 2014-05-15 | Disposition: A | Discharge status: Home / Self Care | Payer: Medicaid Other | Source: Ambulatory Visit | Attending: Obstetrics and Gynecology | Admitting: Obstetrics and Gynecology

## 2014-05-15 DIAGNOSIS — Z3403 Encounter for supervision of normal first pregnancy, third trimester: Secondary | ICD-10-CM

## 2014-05-15 DIAGNOSIS — Z3A33 33 weeks gestation of pregnancy: Secondary | ICD-10-CM | POA: Diagnosis not present

## 2014-05-15 DIAGNOSIS — Z3483 Encounter for supervision of other normal pregnancy, third trimester: Secondary | ICD-10-CM | POA: Diagnosis not present

## 2014-05-15 LAB — CBC WITH DIFFERENTIAL/PLATELET
BASOS ABS: 0 10*3/uL (ref 0.0–0.1)
BASOS PCT: 0 % (ref 0–1)
Eosinophils Absolute: 0.2 10*3/uL (ref 0.0–0.7)
Eosinophils Relative: 1 % (ref 0–5)
HEMATOCRIT: 33.5 % — AB (ref 36.0–46.0)
HEMOGLOBIN: 11.3 g/dL — AB (ref 12.0–15.0)
LYMPHS ABS: 2.1 10*3/uL (ref 0.7–4.0)
Lymphocytes Relative: 17 % (ref 12–46)
MCH: 29.8 pg (ref 26.0–34.0)
MCHC: 33.7 g/dL (ref 30.0–36.0)
MCV: 88.4 fL (ref 78.0–100.0)
MONOS PCT: 7 % (ref 3–12)
Monocytes Absolute: 0.8 10*3/uL (ref 0.1–1.0)
NEUTROS PCT: 75 % (ref 43–77)
Neutro Abs: 9.1 10*3/uL — ABNORMAL HIGH (ref 1.7–7.7)
PLATELETS: 243 10*3/uL (ref 150–400)
RBC: 3.79 MIL/uL — AB (ref 3.87–5.11)
RDW: 13.7 % (ref 11.5–15.5)
WBC: 12.2 10*3/uL — AB (ref 4.0–10.5)

## 2014-05-15 LAB — URINALYSIS, ROUTINE W REFLEX MICROSCOPIC
Bilirubin Urine: NEGATIVE
Glucose, UA: 250 mg/dL — AB
HGB URINE DIPSTICK: NEGATIVE
KETONES UR: NEGATIVE mg/dL
LEUKOCYTES UA: NEGATIVE
Nitrite: NEGATIVE
Protein, ur: NEGATIVE mg/dL
Urobilinogen, UA: 0.2 mg/dL (ref 0.0–1.0)
pH: 6 (ref 5.0–8.0)

## 2014-05-15 LAB — COMPREHENSIVE METABOLIC PANEL
ALK PHOS: 78 U/L (ref 39–117)
ALT: 8 U/L (ref 0–35)
AST: 12 U/L (ref 0–37)
Albumin: 2.9 g/dL — ABNORMAL LOW (ref 3.5–5.2)
Anion gap: 11 (ref 5–15)
BUN: 7 mg/dL (ref 6–23)
CALCIUM: 8.7 mg/dL (ref 8.4–10.5)
CO2: 22 mEq/L (ref 19–32)
CREATININE: 0.5 mg/dL (ref 0.50–1.10)
Chloride: 102 mEq/L (ref 96–112)
GFR calc non Af Amer: >90 mL/min (ref >90)
Glucose, Bld: 96 mg/dL (ref 70–99)
Potassium: 4.2 mEq/L (ref 3.7–5.3)
Sodium: 135 mEq/L — ABNORMAL LOW (ref 137–147)
Total Bilirubin: <0.2 mg/dL — ABNORMAL LOW (ref 0.3–1.2)
Total Protein: 6 g/dL (ref 6.0–8.3)

## 2014-05-15 LAB — LACTATE DEHYDROGENASE: LDH: 179 U/L (ref 94–250)

## 2014-05-15 LAB — URIC ACID: Uric Acid, Serum: 3.3 mg/dL (ref 2.4–7.0)

## 2014-05-15 LAB — PROTEIN / CREATININE RATIO, URINE
CREATININE, URINE: 154.18 mg/dL
PROTEIN CREATININE RATIO: 0.18 — AB (ref 0.00–0.15)
Total Protein, Urine: 27 mg/dL

## 2014-05-15 MED ORDER — OXYCODONE-ACETAMINOPHEN 10-325 MG PO TABS: 1.0000 | ORAL_TABLET | ORAL | Status: DC | PRN

## 2014-05-15 NOTE — MAU Provider Note (Signed)
Ana Hudson is a 23 y.o. G2P0010 at 33.2 weeks presents to MAU c/o temp. Vision changes this morning, HA since this morning that was not relieved with tylenol and decrease fetal movement.  She denies vb or lof.  History     There are no active problems to display for this patient.   Chief Complaint  Patient presents with  . Headache   HPI  OB History    Gravida Para Term Preterm AB TAB SAB Ectopic Multiple Living   2    1  1          History reviewed. No pertinent past medical history.  Past Surgical History  Procedure Laterality Date  . Tonsillectomy and adenoidectomy      History reviewed. No pertinent family history.  History  Substance Use Topics  . Smoking status: Never Smoker   . Smokeless tobacco: Not on file  . Alcohol Use: Yes     Comment: rare    Allergies:  Allergies  Allergen Reactions  . Nickel Rash    Prescriptions prior to admission  Medication Sig Dispense Refill Last Dose  . ibuprofen (ADVIL,MOTRIN) 200 MG tablet Take 200 mg by mouth every 6 (six) hours as needed. For pain.    05/18/2011 at Unknown  . OVER THE COUNTER MEDICATION Take 2 tablets by mouth daily as needed. For  Cough.    05/18/2011 at Unknown    ROS See HPI above, all other systems are negative  Physical Exam   Blood pressure 131/71, pulse 103, temperature 98.2 F (36.8 C), temperature source Oral, resp. rate 18.  Physical Exam Ext:  WNL ABD: Soft, non tender to palpation, no rebound or guarding SVE:   ED Course  Assessment: IUP at  33.2weeks Membranes: intact FHR: Category 1 CTX:  none Declined pain med  Plan:  Labs: PIH    Ayushi Pla, CNM, MSN 05/15/2014. 1:56 PM

## 2014-05-15 NOTE — MAU Note (Signed)
Pt presents to MAU with complaints of a headache since this morning around 9. Reports blurred vision earlier in the day. Denies any vaginal bleeding or LOF

## 2014-05-15 NOTE — MAU Provider Note (Signed)
MAU Addendum Note  Results for orders placed or performed during the hospital encounter of 05/15/14 (from the past 24 hour(s))  Urinalysis, Routine w reflex microscopic     Status: Abnormal   Collection Time: 05/15/14  1:32 PM  Result Value Ref Range   Color, Urine YELLOW YELLOW   APPearance CLEAR CLEAR   Specific Gravity, Urine >1.030 (H) 1.005 - 1.030   pH 6.0 5.0 - 8.0   Glucose, UA 250 (A) NEGATIVE mg/dL   Hgb urine dipstick NEGATIVE NEGATIVE   Bilirubin Urine NEGATIVE NEGATIVE   Ketones, ur NEGATIVE NEGATIVE mg/dL   Protein, ur NEGATIVE NEGATIVE mg/dL   Urobilinogen, UA 0.2 0.0 - 1.0 mg/dL   Nitrite NEGATIVE NEGATIVE   Leukocytes, UA NEGATIVE NEGATIVE  Protein / creatinine ratio, urine     Status: Abnormal   Collection Time: 05/15/14  1:32 PM  Result Value Ref Range   Creatinine, Urine 154.18 mg/dL   Total Protein, Urine 27 mg/dL   Protein Creatinine Ratio 0.18 (H) 0.00 - 0.15  CBC with Differential     Status: Abnormal   Collection Time: 05/15/14  2:01 PM  Result Value Ref Range   WBC 12.2 (H) 4.0 - 10.5 K/uL   RBC 3.79 (L) 3.87 - 5.11 MIL/uL   Hemoglobin 11.3 (L) 12.0 - 15.0 g/dL   HCT 16.133.5 (L) 09.636.0 - 04.546.0 %   MCV 88.4 78.0 - 100.0 fL   MCH 29.8 26.0 - 34.0 pg   MCHC 33.7 30.0 - 36.0 g/dL   RDW 40.913.7 81.111.5 - 91.415.5 %   Platelets 243 150 - 400 K/uL   Neutrophils Relative % 75 43 - 77 %   Neutro Abs 9.1 (H) 1.7 - 7.7 K/uL   Lymphocytes Relative 17 12 - 46 %   Lymphs Abs 2.1 0.7 - 4.0 K/uL   Monocytes Relative 7 3 - 12 %   Monocytes Absolute 0.8 0.1 - 1.0 K/uL   Eosinophils Relative 1 0 - 5 %   Eosinophils Absolute 0.2 0.0 - 0.7 K/uL   Basophils Relative 0 0 - 1 %   Basophils Absolute 0.0 0.0 - 0.1 K/uL  Comprehensive metabolic panel     Status: Abnormal   Collection Time: 05/15/14  2:01 PM  Result Value Ref Range   Sodium 135 (L) 137 - 147 mEq/L   Potassium 4.2 3.7 - 5.3 mEq/L   Chloride 102 96 - 112 mEq/L   CO2 22 19 - 32 mEq/L   Glucose, Bld 96 70 - 99 mg/dL   BUN 7 6 - 23 mg/dL   Creatinine, Ser 7.820.50 0.50 - 1.10 mg/dL   Calcium 8.7 8.4 - 95.610.5 mg/dL   Total Protein 6.0 6.0 - 8.3 g/dL   Albumin 2.9 (L) 3.5 - 5.2 g/dL   AST 12 0 - 37 U/L   ALT 8 0 - 35 U/L   Alkaline Phosphatase 78 39 - 117 U/L   Total Bilirubin <0.2 (L) 0.3 - 1.2 mg/dL   GFR calc non Af Amer >90 >90 mL/min   GFR calc Af Amer >90 >90 mL/min   Anion gap 11 5 - 15  Uric acid     Status: None   Collection Time: 05/15/14  2:01 PM  Result Value Ref Range   Uric Acid, Serum 3.3 2.4 - 7.0 mg/dL  Lactate dehydrogenase     Status: None   Collection Time: 05/15/14  2:01 PM  Result Value Ref Range   LDH 179 94 - 250 U/L  Pt reports feeling better  Plan: -Rx for percocet -Discussed need to follow up in office 04/23/14 -PTL Precautions -Kick counts -Encouraged to call if any questions or concerns arise prior to next scheduled office visit.  -Discharged to home in stable condition    Rhyan Wolters, CNM, MSN 05/15/2014. 3:42 PM

## 2014-05-15 NOTE — Progress Notes (Signed)
Notified of pt arrival in MAU and complaint. Will come see pt 

## 2014-05-30 ENCOUNTER — Inpatient Hospital Stay (HOSPITAL_COMMUNITY)
Admission: AD | Admit: 2014-05-30 | Discharge: 2014-05-31 | Disposition: A | Discharge status: Home / Self Care | Payer: Medicaid Other | Source: Ambulatory Visit | Attending: Obstetrics and Gynecology | Admitting: Obstetrics and Gynecology

## 2014-05-30 ENCOUNTER — Encounter (HOSPITAL_COMMUNITY): Payer: Self-pay | Admitting: *Deleted

## 2014-05-30 DIAGNOSIS — Z87891 Personal history of nicotine dependence: Secondary | ICD-10-CM | POA: Insufficient documentation

## 2014-05-30 DIAGNOSIS — O368130 Decreased fetal movements, third trimester, not applicable or unspecified: Secondary | ICD-10-CM | POA: Insufficient documentation

## 2014-05-30 DIAGNOSIS — Z3A35 35 weeks gestation of pregnancy: Secondary | ICD-10-CM | POA: Insufficient documentation

## 2014-05-30 HISTORY — DX: Anemia, unspecified: D64.9

## 2014-05-30 HISTORY — DX: Unspecified convulsions: R56.9

## 2014-05-30 HISTORY — DX: Cardiac murmur, unspecified: R01.1

## 2014-05-30 NOTE — MAU Note (Signed)
PT SAYS  THE BABY  WAS NOT MOVING  VERY MUCH AND SHE HAS DOPPLER AT  HOME- SO   WHEN SHE LISTENED-  IT  SOUNDED LIKE IT WAS SKIPPING  BEAT.Marland Kitchen.  [facility]-  ALL OK.     DENIES HSV AND MRSA.

## 2014-05-31 DIAGNOSIS — Z87891 Personal history of nicotine dependence: Secondary | ICD-10-CM | POA: Diagnosis not present

## 2014-05-31 DIAGNOSIS — O368130 Decreased fetal movements, third trimester, not applicable or unspecified: Secondary | ICD-10-CM | POA: Diagnosis present

## 2014-05-31 DIAGNOSIS — Z3A35 35 weeks gestation of pregnancy: Secondary | ICD-10-CM | POA: Diagnosis not present

## 2014-05-31 MED ORDER — CITRIC ACID-SODIUM CITRATE 334-500 MG/5ML PO SOLN: 30.0000 mL | Freq: Once | ORAL | Status: DC

## 2014-05-31 NOTE — Discharge Instructions (Signed)

## 2014-05-31 NOTE — MAU Provider Note (Signed)
History  23 yo G2P0010 @ 35.4 wks presents to MAU w/ c/o decreased FM. Has home doppler and upon listening, she heard "skipping" of baby's heartbeat. Denies VB, LOF or ctxs.   Patient Active Problem List   Diagnosis Date Noted  . Decreased fetal movement during pregnancy in third trimester, antepartum 05/31/2014    No chief complaint on file.  HPI As above OB History    Gravida Para Term Preterm AB TAB SAB Ectopic Multiple Living   2    1  1          Past Medical History  Diagnosis Date  . Seizures   . Anemia   . Heart murmur     Past Surgical History  Procedure Laterality Date  . Tonsillectomy and adenoidectomy      No family history on file.  History  Substance Use Topics  . Smoking status: Former Games developermoker  . Smokeless tobacco: Never Used  . Alcohol Use: Yes     Comment: rare when not pregnant    Allergies:  Allergies  Allergen Reactions  . Nickel Rash    No prescriptions prior to admission    ROS  Decreased FM Physical Exam   Blood pressure 112/61, pulse 96, temperature 98.5 F (36.9 C), temperature source Oral, resp. rate 18, height 5\' 8"  (1.727 m), weight 265 lb 4 oz (120.317 kg).  Physical Exam Gen: NAD Abdomen: gravid, soft, NT, non distended, no guarding, no rebound Pelvic: Deferred FHRT: BL 140 w/ moderate variability, +accels, no decels Toco: No ctxs recorded, none palpated  ED Course  Assessment: Cat 1 FHRT  Plan: Reassurance OB f/u Monday, 06/06/14   Sherre ScarletWILLIAMS, Imagene Boss CNM, MS 05/31/2014 1:05 AM

## 2014-06-06 LAB — OB RESULTS CONSOLE GBS: STREP GROUP B AG: NEGATIVE

## 2014-06-17 NOTE — L&D Delivery Note (Signed)
DELIVERY SUMMARY:  11BJ [id number]@23yo G2P0010@ 8966w6d who presented in latent labor.  Due to slow progression labor was augmented with Pitocin and an IUPC to adequately monitor contractions due to obesity.  She received an epidural for pain management.  Pt developed chorioamnionitis due to maternal temp and fetal tachycardia.  Pt was started on  IV Unasyn.  Patient progressed to complete dilation.  Midline episiotomy was performed. Nuchal cord was noted and reduction was attempted; however cord teared.  Shoulder dystocia was noted, posterior arm was grasped and delivered in less than 30sec.  Baby was immediately brought to warmer for awaiting pediatricians.  Placenta spontaneously delivered and was sent to pathology.  Upon delivery of placenta, postpartum hemorrhage was noted due to uterine atony.  Fundal massage was performed with removal of a 300cc clot.  Atony was still noted, cytotec per rectum and Methergine were given..  Postpartum hemorrhage was called.  Additional medication including Hemabate was given.  Banjo curettage was also performed and a small (<1cm) segment of retained placenta was noted.  Gritty texture was noted throughout the uterine cavity and fundus was noted to be firm.  2nd degree repair of the episiotomy was performed in the usual fashion using 2-0 and 3-0 vicryl.  Anesthesia at bedside monitoring fluids.  Total EBL: 2500cc, Pt received 3.5 liters of crystalloid and is currently receiving 2units of pRBC.  Current Hgb 8.6, VSS and lochia is minimal.    Delivery Note At 4:44 AM a viable female was delivered via Vaginal, Spontaneous Delivery (Presentation: Right Occiput Anterior).  APGAR: 2, 8; weight 9 lb 11.2 oz (4400 g).   Placenta status: Intact, Spontaneous.  Cord: 3 vessels with the following complications:  Anesthesia: Epidural  Episiotomy: Median Lacerations: 2nd degree Suture Repair: 2.0 3.0 Est. Blood Loss (mL): 2500  Mom to postpartum.  Baby to NICU.   Myna HidalgoJennifer Heidi Lemay, DO [phone]  (pager) [phone] (office)

## 2014-06-27 ENCOUNTER — Inpatient Hospital Stay (HOSPITAL_COMMUNITY)
Admission: AD | Admit: 2014-06-27 | Discharge: 2014-06-27 | Disposition: A | Discharge status: Home / Self Care | Payer: Medicaid Other | Source: Ambulatory Visit | Attending: Obstetrics and Gynecology | Admitting: Obstetrics and Gynecology

## 2014-06-27 ENCOUNTER — Encounter (HOSPITAL_COMMUNITY): Payer: Self-pay | Admitting: *Deleted

## 2014-06-27 DIAGNOSIS — Z3A39 39 weeks gestation of pregnancy: Secondary | ICD-10-CM | POA: Diagnosis not present

## 2014-06-27 DIAGNOSIS — Z87891 Personal history of nicotine dependence: Secondary | ICD-10-CM | POA: Diagnosis not present

## 2014-06-27 DIAGNOSIS — O368131 Decreased fetal movements, third trimester, fetus 1: Secondary | ICD-10-CM

## 2014-06-27 DIAGNOSIS — O368130 Decreased fetal movements, third trimester, not applicable or unspecified: Secondary | ICD-10-CM | POA: Diagnosis present

## 2014-06-27 LAB — URINE MICROSCOPIC-ADD ON

## 2014-06-27 LAB — URINALYSIS, ROUTINE W REFLEX MICROSCOPIC
Bilirubin Urine: NEGATIVE
Glucose, UA: >1000 mg/dL — AB
Hgb urine dipstick: NEGATIVE
Ketones, ur: 15 mg/dL — AB
Nitrite: NEGATIVE
Protein, ur: NEGATIVE mg/dL
Specific Gravity, Urine: 1.02 (ref 1.005–1.030)
Urobilinogen, UA: 0.2 mg/dL (ref 0.0–1.0)
pH: 6 (ref 5.0–8.0)

## 2014-06-27 NOTE — MAU Note (Signed)
Pt states she hasn't felt the baby move since 0700 this am.  She ate a good breakfast and drank strawberry milk prior to documenting kick counts.  States she only had two movements in 1 hour and 20 min.  No movements since.  Denies vaginal bleeding, abnormal discharge, or ROM.

## 2014-06-27 NOTE — MAU Provider Note (Signed)
History    Ana Hudson is a 24 yo G2P0010 at 39.3wks who presents, after phone call, for decreased fetal movement.  Patient reports two movements in one hour period despite cereal and "cold strawberry milk."  However, upon arrival patient reports perception of  Increased fetal movement and questionable contractions.  Patient denies LOF, VB, and Vaginal Discharge.    Patient Active Problem List   Diagnosis Date Noted  . Decreased fetal movement during pregnancy in third trimester, antepartum 05/31/2014    Chief Complaint  Patient presents with  . Decreased Fetal Movement   HPI  OB History    Gravida Para Term Preterm AB TAB SAB Ectopic Multiple Living   2    1  1    0      Past Medical History  Diagnosis Date  . Seizures   . Anemia   . Heart murmur     Past Surgical History  Procedure Laterality Date  . Tonsillectomy and adenoidectomy    . Addnoids      History reviewed. No pertinent family history.  History  Substance Use Topics  . Smoking status: Former Games developermoker  . Smokeless tobacco: Never Used  . Alcohol Use: Yes     Comment: rare when not pregnant    Allergies:  Allergies  Allergen Reactions  . Nickel Rash    Prescriptions prior to admission  Medication Sig Dispense Refill Last Dose  . acetaminophen (TYLENOL) 500 MG tablet Take 1,000 mg by mouth daily as needed for mild pain, moderate pain or headache.   Past Month at Unknown time  . oxyCODONE-acetaminophen (PERCOCET) 10-325 MG per tablet Take 1 tablet by mouth every 4 (four) hours as needed for pain. 30 tablet 0 Past Month at Unknown time    ROS  See HPI Above Physical Exam   Blood pressure 122/80, pulse 91, temperature 98.2 F (36.8 C), temperature source Oral, resp. rate 20, height 5\' 8"  (1.727 m), weight 270 lb (122.471 kg), SpO2 98 %.  Physical Exam  Constitutional: She is oriented to person, place, and time. She appears well-developed and well-nourished.  HENT:  Head: Normocephalic and  atraumatic.  Eyes: EOM are normal.  Neck: Normal range of motion.  Cardiovascular: Normal rate, regular rhythm and normal heart sounds.   GI: Soft. Bowel sounds are normal.  Genitourinary:  SVE: 1.5/40/-3  Musculoskeletal: Normal range of motion.  Neurological: She is alert and oriented to person, place, and time.  Skin: Skin is warm and dry.  Psychiatric: She has a normal mood and affect.   FHR: 135 bpm, Mod Var, -Decels, +Accels (after position change) UC: None graphed or palpated ED Course  Assessment: IUP at 39.3wks Cat I FT Decreased FM  Plan: -PE as above -FHR reassuring, but not yet reactive -Patient placed on left side, will reassess in 15 min -If continues to be non reactive, will send for BPP  Follow Up (1100) -NST Reactive -Keep appt as scheduled -Encouraged to call if any questions or concerns arise prior to next scheduled office visit.  -Discharged to home in good condition  Shamaria Kavan LYNN CNM, MSN 06/27/2014 10:44 AM

## 2014-06-27 NOTE — Discharge Instructions (Signed)

## 2014-07-06 ENCOUNTER — Encounter (HOSPITAL_COMMUNITY): Payer: Self-pay

## 2014-07-06 ENCOUNTER — Inpatient Hospital Stay (HOSPITAL_COMMUNITY)
Admission: AD | Admit: 2014-07-06 | Discharge: 2014-07-10 | DRG: 774 | Disposition: A | Discharge status: Home / Self Care | Payer: Medicaid Other | Source: Ambulatory Visit | Attending: Obstetrics and Gynecology | Admitting: Obstetrics and Gynecology

## 2014-07-06 DIAGNOSIS — Z3A40 40 weeks gestation of pregnancy: Secondary | ICD-10-CM | POA: Diagnosis present

## 2014-07-06 DIAGNOSIS — Z6841 Body Mass Index (BMI) 40.0 and over, adult: Secondary | ICD-10-CM

## 2014-07-06 DIAGNOSIS — O99214 Obesity complicating childbirth: Secondary | ICD-10-CM | POA: Diagnosis present

## 2014-07-06 DIAGNOSIS — K589 Irritable bowel syndrome without diarrhea: Secondary | ICD-10-CM | POA: Diagnosis not present

## 2014-07-06 DIAGNOSIS — O34593 Maternal care for other abnormalities of gravid uterus, third trimester: Secondary | ICD-10-CM | POA: Diagnosis present

## 2014-07-06 DIAGNOSIS — IMO0002 Reserved for concepts with insufficient information to code with codable children: Secondary | ICD-10-CM | POA: Diagnosis not present

## 2014-07-06 DIAGNOSIS — N979 Female infertility, unspecified: Secondary | ICD-10-CM | POA: Diagnosis not present

## 2014-07-06 DIAGNOSIS — N854 Malposition of uterus: Secondary | ICD-10-CM | POA: Diagnosis present

## 2014-07-06 DIAGNOSIS — O9962 Diseases of the digestive system complicating childbirth: Secondary | ICD-10-CM | POA: Diagnosis present

## 2014-07-06 DIAGNOSIS — Z8249 Family history of ischemic heart disease and other diseases of the circulatory system: Secondary | ICD-10-CM

## 2014-07-06 DIAGNOSIS — O0900 Supervision of pregnancy with history of infertility, unspecified trimester: Secondary | ICD-10-CM

## 2014-07-06 DIAGNOSIS — R5600 Simple febrile convulsions: Secondary | ICD-10-CM | POA: Diagnosis not present

## 2014-07-06 DIAGNOSIS — O411230 Chorioamnionitis, third trimester, not applicable or unspecified: Secondary | ICD-10-CM | POA: Diagnosis present

## 2014-07-06 DIAGNOSIS — Z833 Family history of diabetes mellitus: Secondary | ICD-10-CM

## 2014-07-06 DIAGNOSIS — O411290 Chorioamnionitis, unspecified trimester, not applicable or unspecified: Secondary | ICD-10-CM | POA: Diagnosis not present

## 2014-07-06 DIAGNOSIS — Z87891 Personal history of nicotine dependence: Secondary | ICD-10-CM

## 2014-07-06 NOTE — MAU Note (Signed)
Contractions every 3-5 minutes x 1.5 hours. Membranes swept in office today, was dilated 3 cm. Some bloody show. Positive fetal movement.

## 2014-07-07 ENCOUNTER — Encounter (HOSPITAL_COMMUNITY): Payer: Self-pay

## 2014-07-07 ENCOUNTER — Inpatient Hospital Stay (HOSPITAL_COMMUNITY): Payer: Medicaid Other | Admitting: Anesthesiology

## 2014-07-07 DIAGNOSIS — O9962 Diseases of the digestive system complicating childbirth: Secondary | ICD-10-CM | POA: Diagnosis present

## 2014-07-07 DIAGNOSIS — N854 Malposition of uterus: Secondary | ICD-10-CM | POA: Diagnosis present

## 2014-07-07 DIAGNOSIS — K589 Irritable bowel syndrome without diarrhea: Secondary | ICD-10-CM | POA: Diagnosis not present

## 2014-07-07 DIAGNOSIS — O411230 Chorioamnionitis, third trimester, not applicable or unspecified: Secondary | ICD-10-CM | POA: Diagnosis present

## 2014-07-07 DIAGNOSIS — Z8249 Family history of ischemic heart disease and other diseases of the circulatory system: Secondary | ICD-10-CM | POA: Diagnosis not present

## 2014-07-07 DIAGNOSIS — N979 Female infertility, unspecified: Secondary | ICD-10-CM | POA: Diagnosis not present

## 2014-07-07 DIAGNOSIS — O99214 Obesity complicating childbirth: Secondary | ICD-10-CM | POA: Diagnosis present

## 2014-07-07 DIAGNOSIS — O34593 Maternal care for other abnormalities of gravid uterus, third trimester: Secondary | ICD-10-CM | POA: Diagnosis present

## 2014-07-07 DIAGNOSIS — O0900 Supervision of pregnancy with history of infertility, unspecified trimester: Secondary | ICD-10-CM

## 2014-07-07 DIAGNOSIS — R5600 Simple febrile convulsions: Secondary | ICD-10-CM | POA: Diagnosis not present

## 2014-07-07 DIAGNOSIS — Z87891 Personal history of nicotine dependence: Secondary | ICD-10-CM | POA: Diagnosis not present

## 2014-07-07 DIAGNOSIS — Z833 Family history of diabetes mellitus: Secondary | ICD-10-CM | POA: Diagnosis not present

## 2014-07-07 DIAGNOSIS — Z6841 Body Mass Index (BMI) 40.0 and over, adult: Secondary | ICD-10-CM | POA: Diagnosis not present

## 2014-07-07 DIAGNOSIS — Z3A40 40 weeks gestation of pregnancy: Secondary | ICD-10-CM | POA: Diagnosis present

## 2014-07-07 LAB — CBC
HCT: 32.6 % — ABNORMAL LOW (ref 36.0–46.0)
Hemoglobin: 11.1 g/dL — ABNORMAL LOW (ref 12.0–15.0)
MCH: 29.6 pg (ref 26.0–34.0)
MCHC: 34 g/dL (ref 30.0–36.0)
MCV: 86.9 fL (ref 78.0–100.0)
Platelets: 233 10*3/uL (ref 150–400)
RBC: 3.75 MIL/uL — ABNORMAL LOW (ref 3.87–5.11)
RDW: 14.7 % (ref 11.5–15.5)
WBC: 12.7 10*3/uL — ABNORMAL HIGH (ref 4.0–10.5)

## 2014-07-07 LAB — ABO/RH: ABO/RH(D): B POS

## 2014-07-07 MED ORDER — ACETAMINOPHEN 500 MG PO TABS
1000.0000 mg | ORAL_TABLET | Freq: Once | ORAL | Status: AC
  Administered 2014-07-07: 1000 mg via ORAL
  Filled 2014-07-07: qty 2

## 2014-07-07 MED ORDER — LACTATED RINGERS IV SOLN
INTRAVENOUS | Status: DC
  Administered 2014-07-07: 21:00:00 via INTRAVENOUS
  Administered 2014-07-07: 125 mL/h via INTRAVENOUS
  Administered 2014-07-07 – 2014-07-08 (×2): via INTRAVENOUS

## 2014-07-07 MED ORDER — OXYTOCIN BOLUS FROM INFUSION
500.0000 mL | INTRAVENOUS | Status: DC
  Administered 2014-07-08: 500 mL via INTRAVENOUS

## 2014-07-07 MED ORDER — SODIUM CHLORIDE 0.9 % IV SOLN
3.0000 g | Freq: Four times a day (QID) | INTRAVENOUS | Status: DC
  Administered 2014-07-07 – 2014-07-08 (×5): 3 g via INTRAVENOUS
  Filled 2014-07-07 (×8): qty 3

## 2014-07-07 MED ORDER — FENTANYL 2.5 MCG/ML BUPIVACAINE 1/10 % EPIDURAL INFUSION (WH - ANES)
INTRAMUSCULAR | Status: DC | PRN
  Administered 2014-07-07: 14 mL/h via EPIDURAL

## 2014-07-07 MED ORDER — NALBUPHINE HCL 10 MG/ML IJ SOLN: 5.0000 mg | INTRAMUSCULAR | Status: DC | PRN

## 2014-07-07 MED ORDER — LIDOCAINE-EPINEPHRINE (PF) 2 %-1:200000 IJ SOLN
INTRAMUSCULAR | Status: DC | PRN
  Administered 2014-07-07: 3 mL

## 2014-07-07 MED ORDER — CITRIC ACID-SODIUM CITRATE 334-500 MG/5ML PO SOLN
30.0000 mL | ORAL | Status: DC | PRN
Start: 2014-07-07 — End: 2014-07-08
  Filled 2014-07-07: qty 15

## 2014-07-07 MED ORDER — OXYTOCIN 40 UNITS IN LACTATED RINGERS INFUSION - SIMPLE MED: 62.5000 mL/h | INTRAVENOUS | Status: DC

## 2014-07-07 MED ORDER — OXYCODONE-ACETAMINOPHEN 5-325 MG PO TABS: 2.0000 | ORAL_TABLET | ORAL | Status: DC | PRN

## 2014-07-07 MED ORDER — FLEET ENEMA 7-19 GM/118ML RE ENEM: 1.0000 | ENEMA | RECTAL | Status: DC | PRN

## 2014-07-07 MED ORDER — TERBUTALINE SULFATE 1 MG/ML IJ SOLN: 0.2500 mg | Freq: Once | INTRAMUSCULAR | Status: AC | PRN

## 2014-07-07 MED ORDER — PHENYLEPHRINE 40 MCG/ML (10ML) SYRINGE FOR IV PUSH (FOR BLOOD PRESSURE SUPPORT)
80.0000 ug | PREFILLED_SYRINGE | INTRAVENOUS | Status: DC | PRN
  Filled 2014-07-07 (×2): qty 20
  Filled 2014-07-07: qty 2

## 2014-07-07 MED ORDER — LACTATED RINGERS IV SOLN
500.0000 mL | INTRAVENOUS | Status: DC | PRN
  Administered 2014-07-07: 1000 mL via INTRAVENOUS
  Administered 2014-07-07: 500 mL via INTRAVENOUS
  Administered 2014-07-08: 750 mL via INTRAVENOUS
  Administered 2014-07-08: 1000 mL via INTRAVENOUS

## 2014-07-07 MED ORDER — ACETAMINOPHEN 325 MG PO TABS
650.0000 mg | ORAL_TABLET | ORAL | Status: DC | PRN
  Filled 2014-07-07: qty 2

## 2014-07-07 MED ORDER — OXYTOCIN 40 UNITS IN LACTATED RINGERS INFUSION - SIMPLE MED
1.0000 m[IU]/min | INTRAVENOUS | Status: DC
  Administered 2014-07-07: 2 m[IU]/min via INTRAVENOUS
  Filled 2014-07-07: qty 1000

## 2014-07-07 MED ORDER — PHENYLEPHRINE 40 MCG/ML (10ML) SYRINGE FOR IV PUSH (FOR BLOOD PRESSURE SUPPORT)
80.0000 ug | PREFILLED_SYRINGE | INTRAVENOUS | Status: DC | PRN
  Administered 2014-07-08: 80 ug via INTRAVENOUS
  Filled 2014-07-07: qty 2

## 2014-07-07 MED ORDER — FENTANYL 2.5 MCG/ML BUPIVACAINE 1/10 % EPIDURAL INFUSION (WH - ANES)
14.0000 mL/h | INTRAMUSCULAR | Status: DC | PRN
  Administered 2014-07-07 (×4): 14 mL/h via EPIDURAL
  Filled 2014-07-07 (×4): qty 125

## 2014-07-07 MED ORDER — OXYCODONE-ACETAMINOPHEN 5-325 MG PO TABS
1.0000 | ORAL_TABLET | ORAL | Status: DC | PRN
  Administered 2014-07-08 (×2): 1 via ORAL
  Filled 2014-07-07 (×2): qty 1

## 2014-07-07 MED ORDER — DIPHENHYDRAMINE HCL 50 MG/ML IJ SOLN
12.5000 mg | INTRAMUSCULAR | Status: AC | PRN
  Administered 2014-07-07 – 2014-07-08 (×3): 12.5 mg via INTRAVENOUS
  Filled 2014-07-07 (×2): qty 1

## 2014-07-07 MED ORDER — LIDOCAINE HCL (PF) 1 % IJ SOLN
30.0000 mL | INTRAMUSCULAR | Status: AC | PRN
  Administered 2014-07-08: 30 mL via SUBCUTANEOUS
  Filled 2014-07-07: qty 30

## 2014-07-07 MED ORDER — LACTATED RINGERS IV SOLN: 500.0000 mL | Freq: Once | INTRAVENOUS | Status: DC

## 2014-07-07 MED ORDER — EPHEDRINE 5 MG/ML INJ
10.0000 mg | INTRAVENOUS | Status: DC | PRN
  Filled 2014-07-07: qty 2

## 2014-07-07 MED ORDER — BUPIVACAINE HCL (PF) 0.25 % IJ SOLN
INTRAMUSCULAR | Status: DC | PRN
  Administered 2014-07-07 (×2): 4 mL via EPIDURAL

## 2014-07-07 MED ORDER — ONDANSETRON HCL 4 MG/2ML IJ SOLN
4.0000 mg | Freq: Four times a day (QID) | INTRAMUSCULAR | Status: DC | PRN
  Administered 2014-07-08: 4 mg via INTRAVENOUS
  Filled 2014-07-07: qty 2

## 2014-07-07 NOTE — Progress Notes (Signed)
Ana Hudson emly cnm ordered to turn off pitocin for 1 hour. Restart at 9pm at 6 millunits.

## 2014-07-07 NOTE — Progress Notes (Signed)
Rexene Edisonicole R Caldera MRN: [id number]  Subjective: -Patient reports continued itching.  Mother, sister, and FOB at bedside.   Objective: BP 126/81 mmHg  Pulse 96  Temp(Src) 98.4 F (36.9 C) (Oral)  Resp 20  Ht 5\' 8"  (1.727 m)  Wt 279 lb (126.554 kg)  BMI 42.43 kg/m2  SpO2 97%     FHT: 145 bpm, Mod Var, -Decels, +Accels UC:   Q1-703min, palpates moderate SVE:   Dilation: 5 Effacement (%): 50 Station: -3 Exam by:: Shanda BumpsJessica, CNM Membranes: Intact Pitocin: 258mUn/min  Assessment:  IUP at 40.6wks Cat I FT  Pitocin Augmentation Early-Active Labor  Plan: -Continue position changes to promote fetal descent -Will not perform amniotomy until fetal head engaged further into pelvis -Continue to titrate pitocin as long as fetus and maternal well-being maintained -Discussed POC with patient, no questions, concerns, or needs at current -Continue other mgmt as ordered -Dr. Carmela HurtE. Kulwa to be updated on patient status  Joevanni Roddey LYNN,MSN, CNM 07/07/2014, 11:16 AM

## 2014-07-07 NOTE — Progress Notes (Signed)
Rexene Edisonicole R Folden MRN: [id number]  Subjective: -Nurse call reports patient c/o increased rectal pressure.  Instructed to check patient.  Patient also febrile.    Objective: BP 118/53 mmHg  Pulse 105  Temp(Src) 99.7 F (37.6 C) (Axillary)  Resp 20  Ht 5\' 8"  (1.727 m)  Wt 279 lb (126.554 kg)  BMI 42.43 kg/m2  SpO2 97%   Total I/O In: -  Out: 1250 [Urine:1250] FHT: 150 bpm, Mod Var, -Decels, +Accels UC: Q222min, MVUs 210   SVE:   Dilation: 6 Effacement (%): 90 Station: 0 Exam by:: felkelrn Membranes:AROM x 4hrs Pitocin: 9512mUn/min  Assessment:  IUP at 40.6wks Cat I FT  Febrile GBS Negative Active Labor Pitocin Augmentation  Plan: -Unasyn 3 grams now, then Q6 hours -Dr. Carmela HurtE. Kulwa updated on patient status and advises -Continue titration of pitocin until MVUs consistently >/=23000mmHg for 2 hours, then reevaluate -Continue other mgmt as ordered  [facility], Roe Wilner LYNN,MSN, CNM 07/07/2014, 6:23 PM

## 2014-07-07 NOTE — Progress Notes (Signed)
Ana Hudson MRN: [id number]  Subjective: -Patient reporting rectal pressure and perception of contractions.   Objective: BP 126/76 mmHg  Pulse 89  Temp(Src) 98.4 F (36.9 C) (Oral)  Resp 20  Ht 5\' 8"  (1.727 m)  Wt 279 lb (126.554 kg)  BMI 42.43 kg/m2  SpO2 97%     FHT: 135 bpm, Mod Var, -Decels, +Accels UC:  Q1-783min,   SVE:   Dilation: 5 Effacement (%): 50 Station: -3 Exam by:: Shanda BumpsJessica, CNM Membranes: Intact Pitocin: 26mUn/min  Assessment:  IUP at 40.6wks Cat I FT  Pitocin Augmentation Active Labor  Plan: -Position changes to promote fetal descent -Continue other mgmt as ordered  Joye Wesenberg LYNN,MSN, CNM 07/07/2014, 9:27 AM

## 2014-07-07 NOTE — Progress Notes (Addendum)
Ana Hudson MRN: [id number]  Subjective: -Patient reports some discomfort and perception of contractions.    Objective: BP 114/60 mmHg  Pulse 98  Temp(Src) 98.9 F (37.2 C) (Oral)  Resp 18  Ht 5\' 8"  (1.727 m)  Wt 279 lb (126.554 kg)  BMI 42.43 kg/m2  SpO2 97%     FHT: 150 bpm, Mod Var, -Decels, +Accels UC:  Unable to assess  SVE:   Dilation: 5.5 Effacement (%): 80 Station: -1, 0 Exam by:: Ana Hudson,CNM Membranes:AROM of small amt clear fluid at 1435 Pitocin: 1710mUn/min  Assessment:  IUP at 40.6wks Cat I FT  Early-Active Labor Pitocin Augmentation Amniotomy  Plan: -Amniotomy performed and IUPC inserted  -Discussed VE findings -Will reevaluate in 3-4 hours or prn -Continue other mgmt as ordered   Ana Hare LYNN,MSN, CNM 07/07/2014, 2:38 PM

## 2014-07-07 NOTE — MAU Provider Note (Signed)
History  24 yo G2P0010 @ 40.6 wks presents to MAU after calling w/ c/o ctxs q 3-5 min since 10:30 PM last night. Reports active fetus. Denies LOF or VB, but some "bloody show." Seen in office today; membranes swept. Scheduled for late term IOL on 07/09/2014.  Patient Active Problem List   Diagnosis Date Noted  . Normal labor 07/07/2014    Chief Complaint  Patient presents with  . Labor Eval   HPI As above OB History    Gravida Para Term Preterm AB TAB SAB Ectopic Multiple Living   2    1  1    0      Past Medical History  Diagnosis Date  . Seizures   . Anemia   . Heart murmur     Past Surgical History  Procedure Laterality Date  . Tonsillectomy and adenoidectomy    . Addnoids      History reviewed. No pertinent family history.  History  Substance Use Topics  . Smoking status: Former Games developermoker  . Smokeless tobacco: Never Used  . Alcohol Use: Yes     Comment: rare when not pregnant    Allergies:  Allergies  Allergen Reactions  . Nickel Rash    No prescriptions prior to admission    ROS  Ctxs +FM Physical Exam   Blood pressure 124/73, pulse 102, temperature 98.3 F (36.8 C), temperature source Oral, resp. rate 20, weight 279 lb 2 oz (126.61 kg), SpO2 98 %.   Physical Exam  Gen: NAD Abd: gravid, soft, NT Pelvic: 3/50/-3/med/posterior Cephalic by VE FHRT: BL 150 w/ mod variability, +accels, no decels U/C: None appreciated due to body habitus  ED Course  Assessment: No cervical change since office visit yesterday Latent labor Cat 1 FHRT  Plan: Opts to ambulate x 1-2 hrs and return for reassessment Strict labor precautions during ambulation   Sherre ScarletWILLIAMS, Anita Mcadory CNM, MS 07/07/2014 2:44 AM    ADDENDUM: Returned from 2-hr walk. Contractions more intense per pt report, cvx now 4 cm. Will admit and begin Pitocin. Will update Dr. Su Hiltoberts.

## 2014-07-07 NOTE — Progress Notes (Addendum)
Ana Hudson MRN: [id number]  Subjective: -Care Assumed.  Patient comfortable with epidural.  Reports some itching and minimal relief with benadryl.  Denies rectal/vaginal pressure.    Objective: BP 124/71 mmHg  Pulse 88  Temp(Src) 98.4 F (36.9 C) (Oral)  Resp 20  Ht 5\' 8"  (1.727 m)  Wt 279 lb (126.554 kg)  BMI 42.43 kg/m2  SpO2 97%     FHT: 145 bpm, Mod Var, -Decels, +Accels UC: Q2-653min SVE:   Deferred Membranes: Intact Pitocin:576mUn/min  Assessment:  IUP at 40.6wks Cat I FT  Pitocin Augmentation  Plan: -Will check cervix in 2-3 hours -Plan to AROM and insert IUPC at that time -Patient informed and agrees with POC -Continue other mgmt as ordered  Joshia Kitchings LYNN,MSN, CNM 07/07/2014, 8:29 AM

## 2014-07-07 NOTE — Progress Notes (Signed)
Ana Hudson MRN: [id number]  Subjective: -Nurse call reporting patient tachysystole.  In room to assess.  Patient reports pain with contractions.   Objective: BP 112/78 mmHg  Pulse 110  Temp(Src) 98.7 F (37.1 C) (Oral)  Resp 18  Ht 5\' 8"  (1.727 m)  Wt 279 lb (126.554 kg)  BMI 42.43 kg/m2  SpO2 97% I/O last 3 completed shifts: In: -  Out: 1250 [Urine:1250]   FHT: 155 bpm, Mod Var, -Decels, +Accels UC:  Q1-672min, palpates moderate, MVUs  185 SVE:   Dilation: 6 Effacement (%): 90 Station: 0 Exam by:: Slevin Gunby cnm Membranes:AROM at 1435 Pitocin: 6812mUn/min  Assessment:  IUP at 40.6wks Cat I FT  Pitocin Augmentation Tachysystole  Plan: -Discussed POC with patient -Discontinue pitocin for one hour -Reassess and consider restarting pitocin -Dr. Katharine LookJ. Ozan updated and agrees with POC -Continue other mgmt as ordered  [facility] Inc[facility], Cayle Thunder LYNN,MSN, CNM 07/07/2014, 8:10 PM

## 2014-07-07 NOTE — Progress Notes (Signed)
Ana Hudson MRN: [id number]  Subjective: -Nurse call stating patient temperature has increased.  In room to assess.    Objective: BP 118/81 mmHg  Pulse 109  Temp(Src) 102.8 F (39.3 C) (Oral)  Resp 18  Ht 5\' 8"  (1.727 m)  Wt 279 lb (126.554 kg)  BMI 42.43 kg/m2  SpO2 97% I/O last 3 completed shifts: In: -  Out: 1250 [Urine:1250]   FHT:165 bpm, Mod Var, -Decels, +Accels UC:  Q1-653min, MVUs  125 SVE:   Dilation: 8 Effacement (%): 80 Station: 0 Exam by:: Ana Hudson cnm Membranes: AROM x 7hrs Pitocin: Off  Assessment:  IUP at 40.6wks Cat I FT  Chorioamnionitis Active Labor-Transition  Plan: -Position change to promote fetal descent and cervical change -Fluid bolus and tylenol ex now -Continue other mgmt as ordered -Dr. Katharine LookJ. Hudson updated on patient status  Ana Kocsis LYNN,MSN, CNM 07/07/2014, 10:14 PM

## 2014-07-07 NOTE — Progress Notes (Signed)
  Subjective: Received epidural at 05:00 AM; comfortable. Family at bedside.  Objective: BP 105/50 mmHg  Pulse 99  Temp(Src) 97.9 F (36.6 C) (Oral)  Resp 18  Ht 5\' 8"  (1.727 m)  Wt 279 lb (126.554 kg)  BMI 42.43 kg/m2  SpO2 97%     FHT: Category 1 UC:   irregular, every 3-5 minutes (via palpation) SVE: 4/50/-3, posterior   Pitocin at 4 mius/min (started at 04:15 AM; 2x2)  Assessment:  IUP at term Early labor GBS neg   Plan: Too high for amniotomy Continue w/ current plan Consult prn Expect progress and SVD  Sherre ScarletWILLIAMS, Grettell Ransdell CNM 07/07/2014, 6:29 AM

## 2014-07-07 NOTE — Progress Notes (Signed)
Reported that patient having 15 to 16 UCs in 30 minutes so unable to restart pitocin. Ana Hudson CNM said to keep her updated on UC activity and patient status.

## 2014-07-07 NOTE — Anesthesia Procedure Notes (Signed)
Epidural Patient location during procedure: OB  Staffing Anesthesiologist: Tam Savoia, CHRIS Performed by: anesthesiologist   Preanesthetic Checklist Completed: patient identified, surgical consent, pre-op evaluation, timeout performed, IV checked, risks and benefits discussed and monitors and equipment checked  Epidural Patient position: sitting Prep: site prepped and draped and DuraPrep Patient monitoring: heart rate, cardiac monitor, continuous pulse ox and blood pressure Approach: midline Location: L4-L5 Injection technique: LOR saline  Needle:  Needle type: Tuohy  Needle gauge: 17 G Needle length: 9 cm Needle insertion depth: 7 cm Catheter type: closed end flexible Catheter size: 19 Gauge Catheter at skin depth: 13 cm Test dose: negative and 2% lidocaine with Epi 1:200 K  Assessment Events: blood not aspirated, injection not painful, no injection resistance, negative IV test and no paresthesia  Additional Notes H+P and labs checked, risks and benefits discussed with the patient, consent obtained, procedure tolerated well and without complications.  Reason for block:procedure for pain   

## 2014-07-07 NOTE — Anesthesia Preprocedure Evaluation (Signed)
Anesthesia Evaluation  Patient identified by MRN, date of birth, ID band Patient awake    Reviewed: Allergy & Precautions, NPO status , Patient's Chart, lab work & pertinent test results  History of Anesthesia Complications Negative for: history of anesthetic complications  Airway Mallampati: III  TM Distance: >3 FB Neck ROM: Full    Dental  (+) Teeth Intact   Pulmonary neg shortness of breath, neg sleep apnea, neg COPDneg recent URI, former smoker,  breath sounds clear to auscultation        Cardiovascular negative cardio ROS  Rhythm:Regular     Neuro/Psych negative neurological ROS  negative psych ROS   GI/Hepatic negative GI ROS, Neg liver ROS,   Endo/Other  Morbid obesity  Renal/GU negative Renal ROS     Musculoskeletal   Abdominal   Peds  Hematology  (+) anemia ,   Anesthesia Other Findings   Reproductive/Obstetrics (+) Pregnancy                             Anesthesia Physical Anesthesia Plan  ASA: II  Anesthesia Plan: Epidural   Post-op Pain Management:    Induction:   Airway Management Planned:   Additional Equipment:   Intra-op Plan:   Post-operative Plan:   Informed Consent: I have reviewed the patients History and Physical, chart, labs and discussed the procedure including the risks, benefits and alternatives for the proposed anesthesia with the patient or authorized representative who has indicated his/her understanding and acceptance.     Plan Discussed with: Anesthesiologist  Anesthesia Plan Comments:         Anesthesia Quick Evaluation  

## 2014-07-07 NOTE — Progress Notes (Signed)
Reported patient having 18 UCs in 30 min, MVU are 185. Request SVE and decreasing pitocin to enhance UC pattern if no cervical change.

## 2014-07-07 NOTE — H&P (Signed)
Rexene Edisonicole R Brallier is a 24 y.o. female, G2P0010 at 40.6 weeks by certain LMP c/w u/s at 10.4 wks (EDD 07/01/14), presents for worsening ctxs since 22:30 PM last evening. Pt arrived to MAU ~ 2 hrs after onset of ctxs and cvx was noted to be 3 cm. She opted to ambulate x 2 hrs and return for cervical re-evaluation. Cvx now 4 cm and pt reports ctxs are more intense. Reports bloody show but no active bleeding. Reports active fetus. Denies LOF. Was a scheduled induction on 07/09/14 for late term pregnancy.  Patient Active Problem List   Diagnosis Date Noted  . Normal labor 07/07/2014  . Severe obesity (BMI >= 40) 07/07/2014  . IBS (irritable bowel syndrome) - constipation and diarrhea 07/07/2014  . 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 Simple febrile seizure - at age 26 months, then again at 24 years of age 73/21/2016  . Clomid pregnancy 07/07/2014    History of present pregnancy: Patient entered care at 10.4 weeks.   EDC of 07/01/14 was established by sure LMP and that was c/w 10.4 wk scan.   Anatomy scan: 18.4 weeks, with placenta previa and a posterior right lateral placenta that covers the internal cervical os. Normal placental cord insertion. Anatomy (4 chamber heart, RVOT, LVOT, DA and NB profile not seen); f/u needed. Cvx closed.  Additional US evaluations: 10.4 wks for viability: SIUP, +FHTs. Anteverted uterus, normal fluid. Amnion and YS seen. Cvx closed, normal appearing adnexa. Ovaries not visualized.   21.0 wks - f/u anatomy: Rt lateral central previa. Fetal cardiac anatomy appears normal. NB visualized. Anatomy survey complete. Cvx long and closed. 25.5 wks - f/u previa: Transverse presentation. Head maternal left. Right lateral placenta; 4-6 cm from internal os. Cvx closed, adnexas WNL; placenta previa appears resolved. 36.3 wks - S>D: EFW 7# 5 oz (3324g) (87th%tile), Vtx presentation. Significant prenatal events: Struggled w/ numerous common discomforts of  pregnancy in the 2nd and 3rd trimesters; comfort measures reviewed. C/O decreased FM on 3 separate occasions in the latter part of her pregnancy, with all NSTs reactive. One of the three visits was in the MAU. Pt educated on how to correctly perform kick counts. Dx'd w/ "yeast rash" to chest and Rx'd Terazol at 25+ wks. H/A also around 25+ wks; preeclampsia w/u neg. Reflux at 32.3 wks; told likely diastasis of rectus muscles rather than hernia.  [facility]. Cvx 3/50/-3, membranes swept. BP 122/68. C/O generalized itching; rx'd Benadryl; blood collected for bile acids. Declined both flu and tdap vaccines.  OB History    Gravida Para Term Preterm AB TAB SAB Ectopic Multiple Living   2    1  1    0    SAB on 11/15/2008 at 6 wks; no complications  Past Medical History  Diagnosis Date  . Seizures   . Anemia   . Heart murmur    Past Surgical History  Procedure Laterality Date  . Tonsillectomy and adenoidectomy    . Addnoids     Family History: family history is not on file.CHF, CVA x 3, HTN, COPD, DM, Alzheimer's, Schizophrenia in her MGF, HTN in her father, Asthma in her sister and maternal uncle, and Thyroid dysfunction in her paternal cousin. First cousin w/ hole in heart. Has strong FM of CHF. Social History:  reports that she has quit smoking. She has never used smokeless tobacco. She reports that she drinks alcohol. She reports that she uses illicit drugs (  Marijuana).(Did not smoke marijuana during pregnancy). Pt is a single Caucasian female of [place] descent with a high school diploma and some college. Pt is unemployed with no religous preference. FOB Antonio Little is involved and present, as well as other family members.   Prenatal Transfer Tool  Maternal Diabetes: No Genetic Screening: Normal AFP. Missed window for first trimester screen Maternal Ultrasounds/Referrals: Placenta previa on anatomy scan and scan that followed; resolved by 25.3 wks Fetal  Ultrasounds or other Referrals:  None Maternal Substance Abuse:  Yes:  Type: Marijuana Significant Maternal Medications:  Meds include: Other: PNV, Benadryl prn Significant Maternal Lab Results: Lab values include: Group B Strep negative  TDAP Declined Flu Declined  ROS: Contractions, +FM  Allergies  Allergen Reactions  . Nickel Rash     Dilation: 4 Effacement (%): 50 Station: -3 Exam by:: [place] Shianne Zeiser CNM Blood pressure 116/67, pulse 85, temperature 97.9 F (36.6 C), temperature source Oral, resp. rate 18, height  (1.727 m), weight 279 lb (126.554 kg), SpO2 97 %.  Chest clear Heart RRR Abd gravid, NT, FH S>D Pelvic: As above Ext: WNL EFW: 9 1/2 lbs  FHR: Category 1 UCs: Difficult to trace externally due to habitus  Prenatal labs: ABO, Rh: --/--/B POS, B POS (01/21 0240) Antibody: NEG (01/21 0240) Rubella:   Immune RPR: Nonreactive (06/12 0000)  HBsAg: Negative (06/12 0000)  HIV: Non-reactive (06/12 0000)  GBS: Negative (12/21 0000) Sickle cell/Hgb electrophoresis: NA Pap: 08/2013 GC: Neg on 11/26/13 and 06/06/14 Chlamydia: Neg on 11/26/13 and 06/06/14 Genetic screenings: Neg AFP Glucola: 139 on 04/04/14; 3hr GTT normal Other: Bile acids pending, Fingerstick 127 on 05/09/14 and 149 on 06/23/14 Hgb 13.7 at NOB, 11.3 at 28 weeks   Assessment/Plan: IUP at 40.6 wks Early labor Clomid pregnancy GBS neg Bishop score 3 Suspect LGA  Plan: Admit to Birthing Suite per c/w Dr. Su Hilt Routine [facility] orders Pain med/epidural prn Reviewed R&B of augmentation with patient and significant other, including need for further intervention or c/s. Patient and FOB seem to understand these risks and are agreeable with proceeding.  Anticipate progress and SVD   Sherre Scarlet CNM, MS 07/07/2014, 7:09 AM

## 2014-07-08 ENCOUNTER — Encounter (HOSPITAL_COMMUNITY): Payer: Self-pay

## 2014-07-08 DIAGNOSIS — O411290 Chorioamnionitis, unspecified trimester, not applicable or unspecified: Secondary | ICD-10-CM | POA: Diagnosis not present

## 2014-07-08 DIAGNOSIS — IMO0002 Reserved for concepts with insufficient information to code with codable children: Secondary | ICD-10-CM | POA: Diagnosis not present

## 2014-07-08 LAB — CBC
HCT: 25.2 % — ABNORMAL LOW (ref 36.0–46.0)
HCT: 31.5 % — ABNORMAL LOW (ref 36.0–46.0)
HCT: 27.6 % — ABNORMAL LOW (ref 36.0–46.0)
Hemoglobin: 8.6 g/dL — ABNORMAL LOW (ref 12.0–15.0)
Hemoglobin: 10.8 g/dL — ABNORMAL LOW (ref 12.0–15.0)
Hemoglobin: 9.6 g/dL — ABNORMAL LOW (ref 12.0–15.0)
MCH: 30 pg (ref 26.0–34.0)
MCH: 30.2 pg (ref 26.0–34.0)
MCH: 30 pg (ref 26.0–34.0)
MCHC: 34.1 g/dL (ref 30.0–36.0)
MCHC: 34.3 g/dL (ref 30.0–36.0)
MCHC: 34.8 g/dL (ref 30.0–36.0)
MCV: 87.8 fL (ref 78.0–100.0)
MCV: 88 fL (ref 78.0–100.0)
MCV: 86.3 fL (ref 78.0–100.0)
PLATELETS: 188 10*3/uL (ref 150–400)
PLATELETS: 230 10*3/uL (ref 150–400)
Platelets: 223 10*3/uL (ref 150–400)
RBC: 2.87 MIL/uL — AB (ref 3.87–5.11)
RBC: 3.58 MIL/uL — ABNORMAL LOW (ref 3.87–5.11)
RBC: 3.2 MIL/uL — AB (ref 3.87–5.11)
RDW: 14.8 % (ref 11.5–15.5)
RDW: 14.7 % (ref 11.5–15.5)
RDW: 14.7 % (ref 11.5–15.5)
WBC: 22 10*3/uL — ABNORMAL HIGH (ref 4.0–10.5)
WBC: 30.8 10*3/uL — AB (ref 4.0–10.5)
WBC: 20.6 10*3/uL — ABNORMAL HIGH (ref 4.0–10.5)

## 2014-07-08 LAB — DIC (DISSEMINATED INTRAVASCULAR COAGULATION)PANEL
D-Dimer, Quant: 1.09 ug/mL-FEU — ABNORMAL HIGH (ref 0.00–0.48)
Fibrinogen: 418 mg/dL (ref 204–475)
Fibrinogen: 526 mg/dL — ABNORMAL HIGH (ref 204–475)
INR: 1.4 (ref 0.00–1.49)
Platelets: 210 10*3/uL (ref 150–400)
Prothrombin Time: 17.3 seconds — ABNORMAL HIGH (ref 11.6–15.2)
Smear Review: NONE SEEN
aPTT: 34 seconds (ref 24–37)
aPTT: 33 seconds (ref 24–37)

## 2014-07-08 LAB — DIC (DISSEMINATED INTRAVASCULAR COAGULATION) PANEL
D-Dimer, Quant: 3.77 ug/mL-FEU — ABNORMAL HIGH (ref 0.00–0.48)
INR: 1.4 (ref 0.00–1.49)
PLATELETS: 188 10*3/uL (ref 150–400)
Prothrombin Time: 17.3 seconds — ABNORMAL HIGH (ref 11.6–15.2)
SMEAR REVIEW: NONE SEEN

## 2014-07-08 LAB — POSTPARTUM HEMORRHAGE PROTOCOL (BB NOTIFICATION)

## 2014-07-08 LAB — MASSIVE TRANSFUSION PROTOCOL ORDER (BLOOD BANK NOTIFICATION)

## 2014-07-08 LAB — RPR: RPR: NONREACTIVE

## 2014-07-08 MED ORDER — IBUPROFEN 600 MG PO TABS
600.0000 mg | ORAL_TABLET | Freq: Four times a day (QID) | ORAL | Status: DC
  Administered 2014-07-08 – 2014-07-10 (×9): 600 mg via ORAL
  Filled 2014-07-08 (×10): qty 1

## 2014-07-08 MED ORDER — MISOPROSTOL 200 MCG PO TABS: 800.0000 ug | ORAL_TABLET | Freq: Once | ORAL | Status: DC

## 2014-07-08 MED ORDER — SODIUM CHLORIDE 0.9 % IV SOLN
INTRAVENOUS | Status: DC
  Administered 2014-07-08 (×2): via INTRAVENOUS
  Filled 2014-07-08 (×5): qty 1000

## 2014-07-08 MED ORDER — AMPICILLIN-SULBACTAM SODIUM 3 (2-1) G IJ SOLR
3.0000 g | Freq: Four times a day (QID) | INTRAMUSCULAR | Status: AC
  Administered 2014-07-08: 3 g via INTRAVENOUS
  Filled 2014-07-08: qty 3

## 2014-07-08 MED ORDER — METHYLERGONOVINE MALEATE 0.2 MG/ML IJ SOLN
0.2000 mg | Freq: Once | INTRAMUSCULAR | Status: AC
  Administered 2014-07-08: 0.2 mg via INTRAMUSCULAR

## 2014-07-08 MED ORDER — LIDOCAINE-EPINEPHRINE (PF) 2 %-1:200000 IJ SOLN
INTRAMUSCULAR | Status: AC
  Filled 2014-07-08: qty 20

## 2014-07-08 MED ORDER — SIMETHICONE 80 MG PO CHEW: 80.0000 mg | CHEWABLE_TABLET | ORAL | Status: DC | PRN

## 2014-07-08 MED ORDER — TETANUS-DIPHTH-ACELL PERTUSSIS 5-2.5-18.5 LF-MCG/0.5 IM SUSP
0.5000 mL | Freq: Once | INTRAMUSCULAR | Status: AC
  Administered 2014-07-09: 0.5 mL via INTRAMUSCULAR
  Filled 2014-07-08: qty 0.5

## 2014-07-08 MED ORDER — SODIUM BICARBONATE 8.4 % IV SOLN
INTRAVENOUS | Status: AC
  Filled 2014-07-08: qty 50

## 2014-07-08 MED ORDER — BENZOCAINE-MENTHOL 20-0.5 % EX AERO
1.0000 "application " | INHALATION_SPRAY | CUTANEOUS | Status: DC | PRN
  Administered 2014-07-08: 1 via TOPICAL
  Filled 2014-07-08: qty 56

## 2014-07-08 MED ORDER — OXYCODONE-ACETAMINOPHEN 5-325 MG PO TABS
2.0000 | ORAL_TABLET | ORAL | Status: DC | PRN
  Administered 2014-07-09: 2 via ORAL
  Filled 2014-07-08 (×2): qty 2

## 2014-07-08 MED ORDER — POTASSIUM CHLORIDE IN NACL 20-0.9 MEQ/L-% IV SOLN: INTRAVENOUS | Status: DC

## 2014-07-08 MED ORDER — SENNOSIDES-DOCUSATE SODIUM 8.6-50 MG PO TABS
2.0000 | ORAL_TABLET | ORAL | Status: DC
  Administered 2014-07-09 (×2): 2 via ORAL
  Filled 2014-07-08 (×2): qty 2

## 2014-07-08 MED ORDER — ZOLPIDEM TARTRATE 5 MG PO TABS: 5.0000 mg | ORAL_TABLET | Freq: Every evening | ORAL | Status: DC | PRN

## 2014-07-08 MED ORDER — PROPOFOL 10 MG/ML IV BOLUS
INTRAVENOUS | Status: AC
  Filled 2014-07-08: qty 20

## 2014-07-08 MED ORDER — DIBUCAINE 1 % RE OINT: 1.0000 "application " | TOPICAL_OINTMENT | RECTAL | Status: DC | PRN

## 2014-07-08 MED ORDER — LANOLIN HYDROUS EX OINT: TOPICAL_OINTMENT | CUTANEOUS | Status: DC | PRN

## 2014-07-08 MED ORDER — WITCH HAZEL-GLYCERIN EX PADS: 1.0000 "application " | MEDICATED_PAD | CUTANEOUS | Status: DC | PRN

## 2014-07-08 MED ORDER — ACETAMINOPHEN 500 MG PO TABS: 500.0000 mg | ORAL_TABLET | ORAL | Status: DC | PRN

## 2014-07-08 MED ORDER — SODIUM BICARBONATE 8.4 % IV SOLN
INTRAVENOUS | Status: DC | PRN
  Administered 2014-07-08: 10 mL via EPIDURAL

## 2014-07-08 MED ORDER — OXYTOCIN 40 UNITS IN LACTATED RINGERS INFUSION - SIMPLE MED: 250.0000 mL/h | INTRAVENOUS | Status: DC

## 2014-07-08 MED ORDER — MISOPROSTOL 200 MCG PO TABS
1000.0000 ug | ORAL_TABLET | Freq: Once | ORAL | Status: AC
  Administered 2014-07-08: 1000 ug via RECTAL

## 2014-07-08 MED ORDER — ACETAMINOPHEN 500 MG PO TABS
1000.0000 mg | ORAL_TABLET | Freq: Once | ORAL | Status: DC
  Filled 2014-07-08: qty 2

## 2014-07-08 MED ORDER — FERROUS SULFATE 325 (65 FE) MG PO TABS
325.0000 mg | ORAL_TABLET | Freq: Two times a day (BID) | ORAL | Status: DC
  Administered 2014-07-08 – 2014-07-10 (×4): 325 mg via ORAL
  Filled 2014-07-08 (×4): qty 1

## 2014-07-08 MED ORDER — METHYLERGONOVINE MALEATE 0.2 MG/ML IJ SOLN
INTRAMUSCULAR | Status: AC
  Filled 2014-07-08: qty 1

## 2014-07-08 MED ORDER — CARBOPROST TROMETHAMINE 250 MCG/ML IM SOLN
INTRAMUSCULAR | Status: AC
  Administered 2014-07-08: 250 ug
  Filled 2014-07-08: qty 1

## 2014-07-08 MED ORDER — MISOPROSTOL 200 MCG PO TABS
ORAL_TABLET | ORAL | Status: AC
  Filled 2014-07-08: qty 5

## 2014-07-08 MED ORDER — DIPHENHYDRAMINE HCL 25 MG PO CAPS: 25.0000 mg | ORAL_CAPSULE | Freq: Four times a day (QID) | ORAL | Status: DC | PRN

## 2014-07-08 MED ORDER — OXYCODONE-ACETAMINOPHEN 5-325 MG PO TABS
1.0000 | ORAL_TABLET | ORAL | Status: DC | PRN
Start: 2014-07-08 — End: 2014-07-10
  Administered 2014-07-08 – 2014-07-10 (×3): 1 via ORAL
  Filled 2014-07-08 (×2): qty 1

## 2014-07-08 NOTE — Consult Note (Signed)
Neonatology Note:   Attendance at Delivery:   I was asked by Dr. Ozan to attend this NSVD at 40 6/7 weeks due to suspected chorioamnionitis and NRFHR. The mother is a G2P0A1 B pos, GBS neg with Clomid pregnancy, obesity, and IBS. There was placenta previa early in pregnancy, felt to be resolved. Infant was suspected to be LGA. The mother presented on 1/21 with fever to 102.8 degrees and was felt to have chorioamnionitis. She received Unasyn beginning at 1700 on 1/21. ROM 15 hours prior to delivery, fluid clear. There was fetal tachycardia, then some FHR decelerations in final stages of pushing. At delivery, the cord avulsed. The baby was carried to the warming table and we grasped the cord, then clamped it. The baby was flaccid, pale and blue, apneic, but with HR > 100. Bulb suctioning for moderate thick, white secretions. I applied PPV and continued for 2 minutes, with improving color and perfusion. The baby gasped occasionally at first, then began to breathe more regularly, so PPV was stopped. We bulb suctioned again several times and were getting small amounts of purulent secretions. We DeLee suctioned to the stomach and got about 15 ml of clear fluid out. A pulse oximeter had been placed and we gave BBO2 to maintain O2 saturations within target parameters. We were unable to withdraw the supplemental O2. Ap 2/8. The baby was having mild subcostal retractions and slightly decreased air exchange, but maintained his HR well. Tone improved gradually but remained mildly hypotonic at 10 minutes. The baby was seen by his father, but his mother was not able to see him due to complications following delivery. Transported to NICU for further care, with his father in attendance.  Rajean Desantiago C. Katelyn Kohlmeyer, MD 

## 2014-07-08 NOTE — Progress Notes (Signed)
Ana Hudson MRN: [id number]  Subjective: -Patient reports some discomfort on right side.  States itching remains.  FOB and sister at bedside.     Objective: BP 105/55 mmHg  Pulse 110  Temp(Src) 100.6 F (38.1 C) (Oral)  Resp 18  Ht 5\' 8"  (1.727 m)  Wt 279 lb (126.554 kg)  BMI 42.43 kg/m2  SpO2 97% I/O last 3 completed shifts: In: -  Out: 1250 [Urine:1250]   FHT:165 bpm, Mod Var, -Decels, +Accels UC:  Q2-634min, palpates moderate. MVUs 115  SVE:   Dilation: Lip/rim Effacement (%): 80 Station: 0 Exam by:: Ana Hudson cnm Membranes:AROM x 10hrs Pitocin: Off, Restart at 514mUn/min  Assessment:  IUP at 41wks Cat I FT  Active Labor-Transition Phase Chorioamnionitis  Plan: -Position change to promote fetal descent and reduction of cervical lip -Restart pitocin  -Dr. Katharine LookJ. Ozan updated on patient progress -Continue other mgmt as ordered  [name], [name],MSN, CNM 07/08/2014, 12:28 AM

## 2014-07-08 NOTE — Progress Notes (Signed)
In to see patient--now on 3rd floor. Has stood at bedside, ambulated to BR, ridden to NICU--no syncope or dizziness. Tolerating regular diet, denies N/V. Minimal pain.  Just took Ibuprophen.  Baby stable in NICU  Filed Vitals:   07/08/14 1131 07/08/14 1225 07/08/14 1330 07/08/14 1730  BP: 106/59 107/66 110/61 110/51  Pulse: 78 82 87 95  Temp:  97.8 F (36.6 C) 97.8 F (36.6 C) 98.1 F (36.7 C)  TempSrc:   Oral Oral  Resp: 20 16 18 28   Height:      Weight:      SpO2:  98% 99% 98%   Temp max 101.8 at 0406  CBC Latest Ref Rng 07/08/2014 07/08/2014 07/08/2014  WBC 4.0 - 10.5 K/uL 20.6(H) - 30.8(H)  Hemoglobin 12.0 - 15.0 g/dL 4.0(J9.6(L) - 10.8(L)  Hematocrit 36.0 - 46.0 % 27.6(L) - 31.5(L)  Platelets 150 - 400 K/uL 223 210 230   Epidural cath removed 3:14p by Anesthesia. Still has tingling in left outer thigh, but no limitations in movement or sensation--Anesthesia will monitor.  Foley draining clear urine. Output 1450 until 1200, RN just emptied 500 cc clear urine.  Fundus firm, NT, U/E Lochia small, no clots Perineum healing Ext WNL  Impression: Day 0 SVB, early pp hemorrhage Chorioamnionitis NICU baby  Plan: D/C foley with next ambulation to BR. D/C antibiotics at 4am if remains afebrile. Support to patient for birth experience.  Nigel BridgemanVicki Nelia Rogoff, CNM 07/08/14 6p

## 2014-07-08 NOTE — Progress Notes (Signed)
In to see patient to assess for transfer status. Has sat up in bed without difficulty.  Denies HA, SOB, syncope.  Filed Vitals:   07/08/14 1001 07/08/14 1031 07/08/14 1101 07/08/14 1131  BP: 115/69 95/42 110/57 106/59  Pulse: 88 84 79 78  Temp:   97.6 F (36.4 C)   TempSrc:   Oral   Resp: 18 20 18    Height:      Weight:      SpO2:        CBC--4 hours s/p transfusion CBC Latest Ref Rng 07/08/2014 07/08/2014 07/08/2014  WBC 4.0 - 10.5 K/uL 30.8(H) 22.0(H) -  Hemoglobin 12.0 - 15.0 g/dL 10.8(L) 8.6(L) -  Hematocrit 36.0 - 46.0 % 31.5(L) 25.2(L) -  Platelets 150 - 400 K/uL 230 188 188   Perineum healing Fundus firm, lochia scant Urine output 1050 since 0700, appears concentrated. Ext WNL  Dr. Estanislado Pandyivard in to see patient with this CNM.  No need at present for further transfusions. [facility] Repeat CBC tomorrow. Maintain epidural cath for 12 hours after delivery per Anesthesia. Regular diet Continue ATB at present Continue foley until patient able to ambulate to BR.  Ana Hudson, CNM 07/08/14 1150

## 2014-07-08 NOTE — Progress Notes (Signed)
[name] ordered to restart pitocin at 4 millunits

## 2014-07-08 NOTE — Progress Notes (Signed)
Ana Hudson MRN: [id number]  Subjective: Pt resting comfortably with epidural.    Objective: BP 116/42 mmHg  Pulse 111  Temp(Src) 102.2 F (39 C) (Oral)  Resp 18  Ht 5\' 8"  (1.727 m)  Wt 126.554 kg (279 lb)  BMI 42.43 kg/m2  SpO2 97% I/O last 3 completed shifts: In: -  Out: 1250 [Urine:1250]   FHT:175, moderate variability, +accels, no decels UC:  Q2-614min, palpates moderate. MVUs inadequate  SVE:   Dilation: 10 Effacement (%): complete Station: +1 Exam by:: Sundai Probert  Anterior lip reduced without difficulty during pushing Membranes:AROM Pitocin: Off, will restart Pit  A/P 23yo G2P0010 @ 2568w0d in active labor Cat II FT- due to fetal tachycardia Labor- pt progressed to complete, will continue pushing Chorioamnionitis- IV Unasyn 3g, continue tylenol as needed for Temp Continue epidural for pain management Obesity: BMI [address]42  Claire Dolores, DO [phone] (pager) [phone] (office)

## 2014-07-08 NOTE — Progress Notes (Signed)
1st pad changed since shift change-blue pad 1/3 saturated (mostly under buttocks, brick red bleeding noted, no clots). FF, U/E every assessment. Small bleeding. Pt c/o nausea and cramping- zofran and ibuprofen given.

## 2014-07-08 NOTE — Progress Notes (Signed)
In to check on patient--moving legs better, moderate cramping.  More alert now, feels less hot.  Filed Vitals:   07/08/14 0822 07/08/14 0831 07/08/14 0846 07/08/14 0901  BP:  117/67 117/63 115/76  Pulse:  98 100 99  Temp: 99.2 F (37.3 C)   98.7 F (37.1 C)  TempSrc: Axillary   Oral  Resp:    18  Height:      Weight:      SpO2:       Fundus firm, U/E, small amount lochia.  Received Ibuprophen [id number], Percocet [id number].  Dr. Estanislado Pandyivard updated on patient status at 0900. Will check CBC at 1000--per Dr. Estanislado Pandyivard, will have a low threshold for further transfusions based on result.  Nigel BridgemanVicki Vanette Noguchi, CNM 07/08/14 [phone]

## 2014-07-08 NOTE — Progress Notes (Signed)
   07/08/14 1400  Clinical Encounter Type  Visited With Patient and family together (SO Antonio and mom Angie)  Visit Type Initial;Spiritual support;Social support  Spiritual Encounters  Spiritual Needs Emotional   Visited briefly to introduce Spiritual Care and offer emotional support, but Joni Reiningicole was tired and not feeling well (per pt, pain/nausea).  PT/family aware of ongoing chaplain availability, but please also page as needs arise.  Thank you.  [address]Chaplain Locklyn Henriquez [place], [place] [phone]

## 2014-07-08 NOTE — Anesthesia Postprocedure Evaluation (Signed)
  Anesthesia Post-op Note  Patient: Ana Hudson  Procedure(s) Performed: * No procedures listed *  Patient Location: PACU and Women's Unit  Anesthesia Type:Epidural  Level of Consciousness: awake and patient cooperative  Airway and Oxygen Therapy: Patient Spontanous Breathing  Post-op Pain: mild  Post-op Assessment: Post-op Vital signs reviewed  Post-op Vital Signs: Reviewed and stable  Last Vitals:  Filed Vitals:   07/08/14 1330  BP: 110/61  Pulse: 87  Temp: 36.6 C  Resp: 18    Complications: Bilateral leg and foot strength, able to stand without problems, good sensory to wet and cold bilaterally.  Pt states left outer thigh "feels funny".  Wil report findings to Dr. Sherron AlesK. Jackson, MDA for possoible further monitoring.  epidural catehter removed iwith tip intact.

## 2014-07-08 NOTE — Progress Notes (Signed)
In to see patient--remains on L&D at present. Legs not moving much yet. "Very tired and hot".  C/o nausea, no vomiting. Partner awake at bedside, patient's mother asleep on couch.  Baby in NICU on high-flow O2--undergoing sepsis w/u.  S/P Cytotech, Methergine, Hemabate, curretage  Filed Vitals:   07/08/14 0640 07/08/14 0645 07/08/14 0700 07/08/14 0715  BP:  109/73 114/69 112/61  Pulse: 120 117 109 110  Temp:   99.5 F (37.5 C)   TempSrc:   Oral   Resp:  18 18 20   Height:      Weight:      SpO2: 100% 100%     Completed 2 u PRBCs at 0600.  On Unasyn 3 gm IV since 07/07/14 at 1701.  I&O cath at 0715 = 500 cc  CBC    Component Value Date/Time   WBC 22.0* 07/08/2014 0540   RBC 2.87* 07/08/2014 0540   HGB 8.6* 07/08/2014 0540   HCT 25.2* 07/08/2014 0540   PLT 188 07/08/2014 0540   MCV 87.8 07/08/2014 0540   MCH 30.0 07/08/2014 0540   MCHC 34.1 07/08/2014 0540   RDW 14.8 07/08/2014 0540   LYMPHSABS 2.1 05/15/2014 1401   MONOABS 0.8 05/15/2014 1401   EOSABS 0.2 05/15/2014 1401   BASOSABS 0.0 05/15/2014 1401    PE: Chest clear Heart RRR without murmur Uterus--fundus firm, U/E Perineum--edematous, WNL Lochia small Ext--moving right foot, but unable to bend right knee.  Left leg not moving yet  Impression: S/p SVB at 0444 MLE Grade 3 pph--received 2 u PRBC Chorioamnionitis--on Unasyn  Plan: Maintain NPO at present Insert foley cath Zofran Repeat CBC 6 hours after completion of blood--ordered at 12p. Close observation of status. Hold in L&D at present.  Nigel BridgemanVicki Peaches Vanoverbeke, CNM 07/08/14 8a

## 2014-07-08 NOTE — Progress Notes (Signed)
Stage 3 hemorrhage declared by Dr. Charlotta Newtonzan upon delivery of the placenta. MTP protocol initiated and Code hemorrhage protocol initiated. Dr. Maple HudsonMoser called to be at bedside. 2 CRNA came to aid in hemorrhage, more RNs at bedside to assist. Additional IV access gained in left wrist by Dr Maple HudsonMoser. Cytotec, methergine, and hemabate given. 3.5 liters of total crystalloid fluid was given as well as 2 units red blood cells by Anesthesia team. Phlebotomy at bedside to collect CBC and DIC labs. Initial CBC clotted, additional CBC was drawn.

## 2014-07-09 ENCOUNTER — Inpatient Hospital Stay (HOSPITAL_COMMUNITY): Admission: RE | Admit: 2014-07-09 | Payer: Medicaid Other | Source: Ambulatory Visit

## 2014-07-09 LAB — CBC
HCT: 24 % — ABNORMAL LOW (ref 36.0–46.0)
HEMOGLOBIN: 8.3 g/dL — AB (ref 12.0–15.0)
MCH: 30.1 pg (ref 26.0–34.0)
MCHC: 34.6 g/dL (ref 30.0–36.0)
MCV: 87 fL (ref 78.0–100.0)
Platelets: 223 10*3/uL (ref 150–400)
RBC: 2.76 MIL/uL — AB (ref 3.87–5.11)
RDW: 15.1 % (ref 11.5–15.5)
WBC: 15 10*3/uL — ABNORMAL HIGH (ref 4.0–10.5)

## 2014-07-09 NOTE — Progress Notes (Signed)
When checking patient for her assessment noted large reddened ares on upper right leg.  Patient states she had received an injection after her delivery in the upper right leg and it has been red since.  Advised patient to have MD look at leg when making rounds.  Patient denies any discomfort on area,

## 2014-07-09 NOTE — Progress Notes (Signed)
0900 pt not in room, nurse report she is in the NICU

## 2014-07-09 NOTE — Progress Notes (Signed)
Ana EdisonNicole R Hudson   Subjective: Post Partum Day 1 Vaginal delivery, 2 degree laceration Patient up ad lib, denies syncope or dizziness. Reports consuming regular diet without issues and denies N/V No issues with urination and reports bleeding is appropriate  Feeding:  pumping Contraceptive plan:   unsure  Objective: Temp:  [97.8 F (36.6 C)-98.4 F (36.9 C)] 98.4 F (36.9 C) (01/23 0350) Pulse Rate:  [78-95] 83 (01/23 0350) Resp:  [16-28] 21 (01/23 0350) BP: (106-110)/(51-66) 109/66 mmHg (01/23 0350) SpO2:  [98 %-99 %] 99 % (01/23 0350)  Physical Exam:  General: alert and cooperative Ext: WNL, no edema. No evidence of DVT seen on physical exam. Breast: Soft filling Lungs: CTAB Heart RRR without murmur  Abdomen:  Soft, fundus firm, lochia scant, + bowel sounds, non distended, non tender Lochia: appropriate Uterine Fundus: firm Laceration: healing well    Recent Labs  07/08/14 1611 07/09/14 1000  HGB 9.6* 8.3*  HCT 27.6* 24.0*    Assessment S/P Vaginal Delivery-Day 1 Stable  Normal Involution Breastpumping Circumcision: OP  Plan: Continue current care Plan for discharge tomorrow, Breastfeeding and Lactation consult Lactation support   Ana Hudson, CNM, MSN 07/09/2014, 11:23 AM

## 2014-07-10 ENCOUNTER — Ambulatory Visit: Payer: Self-pay

## 2014-07-10 LAB — CBC
HCT: 22.5 % — ABNORMAL LOW (ref 36.0–46.0)
HEMOGLOBIN: 7.7 g/dL — AB (ref 12.0–15.0)
MCH: 30.3 pg (ref 26.0–34.0)
MCHC: 34.2 g/dL (ref 30.0–36.0)
MCV: 88.6 fL (ref 78.0–100.0)
Platelets: 234 10*3/uL (ref 150–400)
RBC: 2.54 MIL/uL — AB (ref 3.87–5.11)
RDW: 15.2 % (ref 11.5–15.5)
WBC: 8.8 10*3/uL (ref 4.0–10.5)

## 2014-07-10 MED ORDER — IBUPROFEN 600 MG PO TABS: 600.0000 mg | ORAL_TABLET | Freq: Four times a day (QID) | ORAL | Status: DC

## 2014-07-10 MED ORDER — OXYCODONE-ACETAMINOPHEN 5-325 MG PO TABS: 1.0000 | ORAL_TABLET | Freq: Four times a day (QID) | ORAL | Status: DC | PRN

## 2014-07-10 MED ORDER — SENNOSIDES-DOCUSATE SODIUM 8.6-50 MG PO TABS: 2.0000 | ORAL_TABLET | Freq: Every evening | ORAL | Status: DC | PRN

## 2014-07-10 MED ORDER — FERROUS SULFATE 325 (65 FE) MG PO TABS: 325.0000 mg | ORAL_TABLET | Freq: Three times a day (TID) | ORAL | Status: DC

## 2014-07-10 NOTE — Discharge Instructions (Signed)
Contraception Choices Birth control (contraception) is the use of any methods or devices to stop pregnancy from happening. Below are some methods to help avoid pregnancy. HORMONAL BIRTH CONTROL  A small tube put under the skin of the upper arm (implant). The tube can stay in place for 3 years. The implant must be taken out after 3 years.  Shots given every 3 months.  Pills taken every day.  Patches that are changed once a week.  A ring put into the vagina (vaginal ring). The ring is left in place for 3 weeks and removed for 1 week. Then, a new ring is put in the vagina.  Emergency birth control pills taken after unprotected sex (intercourse). BARRIER BIRTH CONTROL   A thin covering worn on the penis (female condom) during sex.  A soft, loose covering put into the vagina (female condom) before sex.  A rubber bowl that sits over the cervix (diaphragm). The bowl must be made for you. The bowl is put into the vagina before sex. The bowl is left in place for 6 to 8 hours after sex.  A small, soft cup that fits over the cervix (cervical cap). The cup must be made for you. The cup can be left in place for 48 hours after sex.  A sponge that is put into the vagina before sex.  A chemical that kills or stops sperm from getting into the cervix and uterus (spermicide). The chemical may be a cream, jelly, foam, or pill. INTRAUTERINE (IUD) BIRTH CONTROL   IUD birth control is a small, T-shaped piece of plastic. The plastic is put inside the uterus. There are 2 types of IUD:  Copper IUD. The IUD is covered in copper wire. The copper makes a fluid that kills sperm. It can stay in place for 10 years.  Hormone IUD. The hormone stops pregnancy from happening. It can stay in place for 5 years. PERMANENT METHODS  When the woman has her fallopian tubes sealed, tied, or blocked during surgery. This stops the egg from traveling to the uterus.  The doctor places a small coil or insert into each fallopian  tube. This causes scar tissue to form and blocks the fallopian tubes.  When the female has the tubes that carry sperm tied off (vasectomy). NATURAL FAMILY PLANNING BIRTH CONTROL   Natural family planning means not having sex or using barrier birth control on the days the woman could become pregnant.  Use a calendar to keep track of the length of each period and know the days she can get pregnant.  Avoid sex during ovulation.  Use a thermometer to measure body temperature. Also watch for symptoms of ovulation.  Time sex to be after the woman has ovulated. Use condoms to help protect yourself against sexually transmitted infections (STIs). Do this no matter what type of birth control you use. Talk to your doctor about which type of birth control is best for you. Document Released: 03/31/2009 Document Revised: 06/08/2013 Document Reviewed: 12/23/2012 [facility] Patient Information 2015 [place], [place]. This information is not intended to replace advice given to you by your health care provider. Make sure you discuss any questions you have with your health care provider. Postpartum Depression and Baby Blues The postpartum period begins right after the birth of a baby. During this time, there is often a great amount of joy and excitement. It is also a time of many changes in the life of the parents. Regardless of how many times a mother gives  birth, each child brings new challenges and dynamics to the family. It is not unusual to have feelings of excitement along with confusing shifts in moods, emotions, and thoughts. All mothers are at risk of developing postpartum depression or the "baby blues." These mood changes can occur right after giving birth, or they may occur many months after giving birth. The baby blues or postpartum depression can be mild or severe. Additionally, postpartum depression can go away rather quickly, or it can be a long-term condition.  CAUSES Raised hormone levels and the rapid  drop in those levels are thought to be a main cause of postpartum depression and the baby blues. A number of hormones change during and after pregnancy. Estrogen and progesterone usually decrease right after the delivery of your baby. The levels of thyroid hormone and various cortisol steroids also rapidly drop. Other factors that play a role in these mood changes include major life events and genetics.  RISK FACTORS If you have any of the following risks for the baby blues or postpartum depression, know what symptoms to watch out for during the postpartum period. Risk factors that may increase the likelihood of getting the baby blues or postpartum depression include:  Having a personal or family history of depression.   Having depression while being pregnant.   Having premenstrual mood issues or mood issues related to oral contraceptives.  Having a lot of life stress.   Having marital conflict.   Lacking a social support network.   Having a baby with special needs.   Having health problems, such as diabetes.  SIGNS AND SYMPTOMS Symptoms of baby blues include:  Brief changes in mood, such as going from extreme happiness to sadness.  Decreased concentration.   Difficulty sleeping.   Crying spells, tearfulness.   Irritability.   Anxiety.  Symptoms of postpartum depression typically begin within the first month after giving birth. These symptoms include:  Difficulty sleeping or excessive sleepiness.   Marked weight loss.   Agitation.   Feelings of worthlessness.   Lack of interest in activity or food.  Postpartum psychosis is a very serious condition and can be dangerous. Fortunately, it is rare. Displaying any of the following symptoms is cause for immediate medical attention. Symptoms of postpartum psychosis include:   Hallucinations and delusions.   Bizarre or disorganized behavior.   Confusion or disorientation.  DIAGNOSIS  A diagnosis is made  by an evaluation of your symptoms. There are no medical or lab tests that lead to a diagnosis, but there are various questionnaires that a health care provider may use to identify those with the baby blues, postpartum depression, or psychosis. Often, a screening tool called the [place] Postnatal Depression Scale is used to diagnose depression in the postpartum period.  TREATMENT The baby blues usually goes away on its own in 1-2 weeks. Social support is often all that is needed. You will be encouraged to get adequate sleep and rest. Occasionally, you may be given medicines to help you sleep.  Postpartum depression requires treatment because it can last several months or longer if it is not treated. Treatment may include individual or group therapy, medicine, or both to address any social, physiological, and psychological factors that may play a role in the depression. Regular exercise, a healthy diet, rest, and social support may also be strongly recommended.  Postpartum psychosis is more serious and needs treatment right away. Hospitalization is often needed. HOME CARE INSTRUCTIONS  Get as much rest as you can. Nap  when the baby sleeps.   Exercise regularly. Some women find yoga and walking to be beneficial.   Eat a balanced and nourishing diet.   Do little things that you enjoy. Have a cup of tea, take a bubble bath, read your favorite magazine, or listen to your favorite music.  Avoid alcohol.   Ask for help with household chores, cooking, grocery shopping, or running errands as needed. Do not try to do everything.   Talk to people close to you about how you are feeling. Get support from your partner, family members, friends, or other new moms.  Try to stay positive in how you think. Think about the things you are grateful for.   Do not spend a lot of time alone.   Only take over-the-counter or prescription medicine as directed by your health care provider.  Keep all your  postpartum appointments.   Let your health care provider know if you have any concerns.  SEEK MEDICAL CARE IF: You are having a reaction to or problems with your medicine. SEEK IMMEDIATE MEDICAL CARE IF:  You have suicidal feelings.   You think you may harm the baby or someone else. MAKE SURE YOU:  Understand these instructions.  Will watch your condition.  Will get help right away if you are not doing well or get worse. Document Released: 03/07/2004 Document Revised: 06/08/2013 Document Reviewed: 03/15/2013 [facility] Patient Information 2015 [place], [place]. This information is not intended to replace advice given to you by your health care provider. Make sure you discuss any questions you have with your health care provider. Iron-Rich Diet An iron-rich diet contains foods that are good sources of iron. Iron is an important mineral that helps your body produce hemoglobin. Hemoglobin is a protein in red blood cells that carries oxygen to the body's tissues. Sometimes, the iron level in your blood can be low. This may be caused by:  A lack of iron in your diet.  Blood loss.  Times of growth, such as during pregnancy or during a child's growth and development. Low levels of iron can cause a decrease in the number of red blood cells. This can result in iron deficiency anemia. Iron deficiency anemia symptoms include:  Tiredness.  Weakness.  Irritability.  Increased chance of infection. Here are some recommendations for daily iron intake:  Males older than 26 years of age need 8 mg of iron per day.  Women ages 45 to 59 need 18 mg of iron per day.  Pregnant women need 27 mg of iron per day, and women who are over 52 years of age and breastfeeding need 9 mg of iron per day.  Women over the age of 50 need 8 mg of iron per day. SOURCES OF IRON There are 2 types of iron that are found in food: heme iron and nonheme iron. Heme iron is absorbed by the body better than nonheme  iron. Heme iron is found in meat, poultry, and fish. Nonheme iron is found in grains, beans, and vegetables. Heme Iron Sources Food / Iron (mg)  Chicken liver, 3 oz (85 g)/ 10 mg  Beef liver, 3 oz (85 g)/ 5.5 mg  Oysters, 3 oz (85 g)/ 8 mg  Beef, 3 oz (85 g)/ 2 to 3 mg  Shrimp, 3 oz (85 g)/ 2.8 mg  [place], 3 oz (85 g)/ 2 mg  Chicken, 3 oz (85 g) / 1 mg  Fish (tuna, halibut), 3 oz (85 g)/ 1 mg  Pork, 3 oz (  85 g)/ 0.9 mg Nonheme Iron Sources Food / Iron (mg)  Ready-to-eat breakfast cereal, iron-fortified / 3.9 to 7 mg  Tofu,  cup / 3.4 mg  Kidney beans,  cup / 2.6 mg  Baked potato with skin / 2.7 mg  Asparagus,  cup / 2.2 mg  Avocado / 2 mg  Dried peaches,  cup / 1.6 mg  Raisins,  cup / 1.5 mg  Soy milk, 1 cup / 1.5 mg  Whole-wheat bread, 1 slice / 1.2 mg  Spinach, 1 cup / 0.8 mg  Broccoli,  cup / 0.6 mg IRON ABSORPTION Certain foods can decrease the body's absorption of iron. Try to avoid these foods and beverages while eating meals with iron-containing foods:  Coffee.  Tea.  Fiber.  Soy. Foods containing vitamin C can help increase the amount of iron your body absorbs from iron sources, especially from nonheme sources. Eat foods with vitamin C along with iron-containing foods to increase your iron absorption. Foods that are high in vitamin C include many fruits and vegetables. Some good sources are:  Fresh orange juice.  Oranges.  Strawberries.  Mangoes.  Grapefruit.  Red bell peppers.  Green bell peppers.  Broccoli.  Potatoes with skin.  Tomato juice. Document Released: 01/15/2005 Document Revised: 08/26/2011 Document Reviewed: 11/22/2010 [facility] Patient Information 2015 [place], [place]. This information is not intended to replace advice given to you by your health care provider. Make sure you discuss any questions you have with your health care provider.

## 2014-07-10 NOTE — Progress Notes (Signed)
Discharge instructions reviewed with patient.  Patient states understanding of home care, medications, activity, signs/symptoms to report to MD and return MD office visit.  Patients significant other and family will assist with her care @ home.  Patient given prescriptions, has all personal belongings and no home equipment needed.  Patient ambulated for discharge in stable condition with staff without incident.

## 2014-07-10 NOTE — Progress Notes (Signed)
In room to assess and discharge patient.  Patient currently in NICU.  Patient's mother, Marylene Landngela, in room.  Questions regarding discharge process and breastfeeding pump addressed.  Marylene Landngela able to update provider on infant status, stating neonatology suspects and has tested for meningitis.  Marylene Landngela also express concern regarding distance from hospital [place].  Informed that provider will look into options for patient. Patient arrived on unit prior to provider exit.    J.Jenella Craigie, CNM 9:51 AM

## 2014-07-10 NOTE — Lactation Note (Signed)
This note was copied from the chart of Boy Bradd Burnericole Vanwyk. Lactation Consultation Note      Initial consult on the mom of a NICU baby, now 855 hours old, full term. Mom was taught hand expression, and was given a NICU book on how to provide EBm for your baby, and this was reviewed with her. Breast care and pumping reviewed with mom. She was able to express small drops of colostrum. i explained that her milk should begin transitioning in  Mature milk by tomorrow. Mom knows to call for lactation through her baby's nurse in NICU prn.   Patient Name: Boy Bradd Burnericole Laboy WUJWJ'XToday's Date: 07/10/2014 Reason for consult: Initial assessment   Maternal Data Formula Feeding for Exclusion: Yes (baby in NICU)  Feeding    LATCH Score/Interventions                      Lactation Tools Discussed/Used WIC Program: Yes (mom active with WIC  - [place]) Pump Review: Setup, frequency, and cleaning;Milk Storage;Other (comment) (hand expression taught) Initiated by:: bedside RN Date initiated:: 07/08/14   Consult Status Consult Status: Follow-up Follow-up type: In-patient (NICU)    Alfred LevinsLee, [place] Blanc[place]te Anne 07/10/2014, 2:58 PM

## 2014-07-10 NOTE — Discharge Summary (Signed)
Vaginal Delivery Discharge Summary  Ana Hudson  DOB:    02-Dec-1990 MRN:    [id number] CSN:    [id number]  Date of admission:                  Jul 06, 2014  Date of discharge:                   Jul 10, 2014  Procedures this admission:   SVD with MLE, Immediate PPH, Blood Transfusion  Date of Delivery: Jul 08, 2014  Newborn Data:  Live born female  Birth Weight: 9 lb 11.2 oz (4400 g) APGAR: 2, 8  Remains in NICU at mother discharge Name: AJ Circumcision Plan: Outpatient  History of Present Illness:  Ana Hudson is a 24 y.o. female, G2P1011, who presents at 4185w0d weeks gestation. [facility]. She was admitted onset of labor. Her pregnancy has been complicated by:  Patient Active Problem List   Diagnosis Date Noted  . Vaginal delivery 07/08/2014  . Postpartum hemorrhage 59/22/2016  . Shoulder dystocia 07/08/2014  . Chorioamnionitis 07/08/2014  . Severe obesity (BMI >= 40) 07/07/2014  . IBS (irritable bowel syndrome) - constipation and diarrhea 07/07/2014  . 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 Simple febrile seizure - at age 26 months, then again at 24 years of age 59/21/2016  . Clomid pregnancy 07/07/2014     Hospital Course:  Admitted for onset of labor. Negative GBS. Progressed with pitocin augmentation and AROM. Utilized epidural for pain management.  Patient with confirmed chorioamnionitis prior to delivery and delivery was performed by Dr. Katharine LookJ. Ozan with complications secondary to shoulder dystocia. Patient did receive a MLE and also had a labial tear that was repaired. Infant was unstable and was taken to NICU for further observation and assessment.  Mother then had an immediate postpartum hemorrhage, resulting in 2500 EBL after initiation of uterotonic's, crystalloids, transfusion of 2U PRBC, and curettage with good results.   Despite occasional pain, patient recovered well and was pumping with good results.  Mom's physical exam was WNL, and she was discharged home in stable condition. Contraception plan was undecided.  She received adequate benefit from po pain medications, ibuprofen and percocet.  Due to patient's decreased HgB of 7.7, she was offered and declined another blood transfusion and instead started on iron supplementation.     Feeding:  breast  Contraception:  abstinence  Discharge hemoglobin:  HEMOGLOBIN  Date Value Ref Range Status  07/10/2014 7.7* 12.0 - 15.0 g/dL Final   HCT  Date Value Ref Range Status  07/10/2014 22.5* 36.0 - 46.0 % Final    Discharge Physical Exam:   General: alert, cooperative, no distress, morbidly obese and pale  Chest: Lungs CTA, Heart RRR Abdomen: Soft, NT Uterine Fundus: firm, U/-1 Lochia: appropriate Laceration: Healing weel DVT Evaluation: No evidence of DVT seen on physical exam. Calf/Ankle edema is present.  Intrapartum Procedures: spontaneous vaginal delivery, episiotomy ML and Chorio Prophylaxis Postpartum Procedures: transfusion 2U PRBC, curettage and uterotonics, and antibiotics x 24 hrs Complications-Operative and Postpartum: labial laceration  Discharge Diagnoses: Post-date pregnancy and SVD, Shoulder Dystocia, PPH  Discharge Information:  Activity:           pelvic rest Diet:                routine Medications: PNV, Ibuprofen, Colace, Iron and Percocet Condition:  stable Instructions:  Pain Management, Peri-Care, Breastfeeding, Who and When to call for postpartum complications. Information Sheet(s) given Iron Rich Diet, Contraception Choices, PP BB & Depression  Discharge to: home  Follow-up Information    Follow up with [facility]. Schedule an appointment as soon as possible for a visit in 6 weeks.   Specialty:  Obstetrics and Gynecology   Why:  Please call if you have any questions or concerns prior  to your next visit.   Contact information:   [address] [place] [address] [phone]       Gerrit Heck [facility], CNM 07/10/2014 10:08 AM

## 2014-07-11 ENCOUNTER — Ambulatory Visit: Payer: Self-pay

## 2014-07-11 ENCOUNTER — Encounter (HOSPITAL_COMMUNITY): Payer: Self-pay | Admitting: *Deleted

## 2014-07-11 ENCOUNTER — Inpatient Hospital Stay (HOSPITAL_COMMUNITY)
Admission: AD | Admit: 2014-07-11 | Discharge: 2014-07-11 | Disposition: A | Discharge status: Home / Self Care | Payer: Medicaid Other | Source: Ambulatory Visit | Attending: Obstetrics and Gynecology | Admitting: Obstetrics and Gynecology

## 2014-07-11 DIAGNOSIS — O9089 Other complications of the puerperium, not elsewhere classified: Secondary | ICD-10-CM | POA: Insufficient documentation

## 2014-07-11 DIAGNOSIS — R42 Dizziness and giddiness: Secondary | ICD-10-CM | POA: Diagnosis present

## 2014-07-11 LAB — CBC WITH DIFFERENTIAL/PLATELET
BASOS ABS: 0 10*3/uL (ref 0.0–0.1)
Basophils Relative: 0 % (ref 0–1)
EOS PCT: 3 % (ref 0–5)
Eosinophils Absolute: 0.3 10*3/uL (ref 0.0–0.7)
HCT: 25.6 % — ABNORMAL LOW (ref 36.0–46.0)
HEMOGLOBIN: 8.7 g/dL — AB (ref 12.0–15.0)
LYMPHS ABS: 1.9 10*3/uL (ref 0.7–4.0)
LYMPHS PCT: 20 % (ref 12–46)
MCH: 30.3 pg (ref 26.0–34.0)
MCHC: 34 g/dL (ref 30.0–36.0)
MCV: 89.2 fL (ref 78.0–100.0)
MONOS PCT: 5 % (ref 3–12)
Monocytes Absolute: 0.5 10*3/uL (ref 0.1–1.0)
Neutro Abs: 6.5 10*3/uL (ref 1.7–7.7)
Neutrophils Relative %: 72 % (ref 43–77)
PLATELETS: 305 10*3/uL (ref 150–400)
RBC: 2.87 MIL/uL — AB (ref 3.87–5.11)
RDW: 15 % (ref 11.5–15.5)
WBC: 9.1 10*3/uL (ref 4.0–10.5)

## 2014-07-11 LAB — COMPREHENSIVE METABOLIC PANEL
ALK PHOS: 83 U/L (ref 39–117)
ALT: 28 U/L (ref 0–35)
ANION GAP: 6 (ref 5–15)
AST: 29 U/L (ref 0–37)
Albumin: 2.5 g/dL — ABNORMAL LOW (ref 3.5–5.2)
BUN: 10 mg/dL (ref 6–23)
CALCIUM: 8.1 mg/dL — AB (ref 8.4–10.5)
CO2: 25 mmol/L (ref 19–32)
CREATININE: 0.55 mg/dL (ref 0.50–1.10)
Chloride: 109 mmol/L (ref 96–112)
GFR calc Af Amer: >90 mL/min (ref >90)
GLUCOSE: 75 mg/dL (ref 70–99)
Potassium: 4.2 mmol/L (ref 3.5–5.1)
Sodium: 140 mmol/L (ref 135–145)
Total Bilirubin: 0.3 mg/dL (ref 0.3–1.2)
Total Protein: 5.1 g/dL — ABNORMAL LOW (ref 6.0–8.3)

## 2014-07-11 LAB — URINALYSIS, ROUTINE W REFLEX MICROSCOPIC
Bilirubin Urine: NEGATIVE
Glucose, UA: NEGATIVE mg/dL
Ketones, ur: NEGATIVE mg/dL
Nitrite: NEGATIVE
PH: 6 (ref 5.0–8.0)
PROTEIN: NEGATIVE mg/dL
Specific Gravity, Urine: 1.025 (ref 1.005–1.030)
Urobilinogen, UA: 0.2 mg/dL (ref 0.0–1.0)

## 2014-07-11 LAB — TYPE AND SCREEN
ABO/RH(D): B POS
Antibody Screen: NEGATIVE
UNIT DIVISION: 0
Unit division: 0
Unit division: 0
Unit division: 0

## 2014-07-11 LAB — URINE MICROSCOPIC-ADD ON

## 2014-07-11 MED ORDER — PANTOPRAZOLE SODIUM 40 MG PO TBEC: 40.0000 mg | DELAYED_RELEASE_TABLET | Freq: Every day | ORAL | Status: DC | PRN

## 2014-07-11 MED ORDER — LACTATED RINGERS IV SOLN
INTRAVENOUS | Status: DC
Start: 2014-07-11 — End: 2014-07-12
  Administered 2014-07-11: 21:00:00 via INTRAVENOUS

## 2014-07-11 MED ORDER — PANTOPRAZOLE SODIUM 40 MG IV SOLR
40.0000 mg | Freq: Once | INTRAVENOUS | Status: AC
  Administered 2014-07-11: 40 mg via INTRAVENOUS
  Filled 2014-07-11: qty 40

## 2014-07-11 MED ORDER — LACTATED RINGERS IV BOLUS (SEPSIS)
1000.0000 mL | Freq: Once | INTRAVENOUS | Status: AC
  Administered 2014-07-11: 1000 mL via INTRAVENOUS

## 2014-07-11 MED ORDER — SIMETHICONE 80 MG PO CHEW: 80.0000 mg | CHEWABLE_TABLET | Freq: Four times a day (QID) | ORAL | Status: DC | PRN

## 2014-07-11 NOTE — MAU Provider Note (Signed)
History  Ana Hudson is a 24 yo G2nowP1011, s/p vaginal delivery on 07/08/2014, who presents to MAU @ ~ 5:15 PM w/ c/o lightheadedness, dizzy, nausea and feeling hot/clammy while visiting her baby in the NICU for the first time since discharge on 07/10/14. Denies fevers, chills, vomiting, significant pain or dysuria. Reports good appetite; last meal between 12:30 - 1PM today, +flatus and last BM yesterday. Admits to drinking only 1-2 bottles of water today; reports slight headache. Reports average of 5-6 hrs of sleep per night since delivery. On Ferrous Sulfate tid; last dose between 12:30-1 PM today, last Ibuprofen at same time. Last Percocet 10 PM last night.   Delivery/Postpartum course c/b chorio, cord avulsion, grade 3 PPH, blood transfusion, shoulder dystocia, macrosomia and obesity. Ana Hudson was GBS neg.  Reports that infant is improving and pumping going well.  Patient Active Problem List   Diagnosis Date Noted  . Vaginal delivery 07/08/2014  . Postpartum hemorrhage 74/22/2016  . Shoulder dystocia 07/08/2014  . Chorioamnionitis 07/08/2014  . Severe obesity (BMI >= 40) 07/07/2014  . IBS (irritable bowel syndrome) - constipation and diarrhea 07/07/2014  . 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 Simple febrile seizure - at age 26 months, then again at 24 years of age 74/21/2016  . Clomid pregnancy 07/07/2014    Chief Complaint  Patient presents with  . Dizziness   HPI As above OB History    Gravida Para Term Preterm AB TAB SAB Ectopic Multiple Living   2 1 1  1  1   0 1      Past Medical History  Diagnosis Date  . Seizures   . Anemia   . Heart murmur     Past Surgical History  Procedure Laterality Date  . Tonsillectomy and adenoidectomy    . Addnoids      History reviewed. No pertinent family history.  History  Substance Use Topics  . Smoking status: Former Games developermoker  . Smokeless tobacco: Never Used  . Alcohol Use: Yes     Comment: rare when  not pregnant    Allergies:  Allergies  Allergen Reactions  . Nickel Rash    Prescriptions prior to admission  Medication Sig Dispense Refill Last Dose  . ferrous sulfate 325 (65 FE) MG tablet Take 1 tablet (325 mg total) by mouth 3 (three) times daily with meals. Take TID WC for 2 weeks then reduce to BID WC for 4 weeks. 90 tablet 3   . ibuprofen (ADVIL,MOTRIN) 600 MG tablet Take 1 tablet (600 mg total) by mouth every 6 (six) hours. 30 tablet 2   . oxyCODONE-acetaminophen (PERCOCET/ROXICET) 5-325 MG per tablet Take 1-2 tablets by mouth every 6 (six) hours as needed (for pain scale equal to or greater than 7). 30 tablet 0   . senna-docusate (SENOKOT-S) 8.6-50 MG per tablet Take 2 tablets by mouth at bedtime as needed for mild constipation. 30 tablet 0     ROS  Lightheadedness, dizziness, feeling hot Physical Exam   Hemoglobin:  07/08/14 @ 5:40 PM  ---- 8.6 07/08/14 @ 10 AM ---- 10.8 07/08/14 @ 16:11 PM ---- 9.6 07/08/14 @ 10 AM ---- 8.3 07/10/14 @ 7:34 AM ----- 7.7  Results for orders placed or performed during the hospital encounter of 07/11/14 (from the past 24 hour(s))  CBC with Differential     Status: Abnormal   Collection Time: 07/11/14  7:45 PM  Result Value Ref Range   WBC 9.1 4.0 - 10.5 K/uL  RBC 2.87 (L) 3.87 - 5.11 MIL/uL   Hemoglobin 8.7 (L) 12.0 - 15.0 g/dL   HCT 16.1 (L) 09.6 - 04.5 %   MCV 89.2 78.0 - 100.0 fL   MCH 30.3 26.0 - 34.0 pg   MCHC 34.0 30.0 - 36.0 g/dL   RDW 40.9 81.1 - 91.4 %   Platelets 305 150 - 400 K/uL   Neutrophils Relative % 72 43 - 77 %   Neutro Abs 6.5 1.7 - 7.7 K/uL   Lymphocytes Relative 20 12 - 46 %   Lymphs Abs 1.9 0.7 - 4.0 K/uL   Monocytes Relative 5 3 - 12 %   Monocytes Absolute 0.5 0.1 - 1.0 K/uL   Eosinophils Relative 3 0 - 5 %   Eosinophils Absolute 0.3 0.0 - 0.7 K/uL   Basophils Relative 0 0 - 1 %   Basophils Absolute 0.0 0.0 - 0.1 K/uL  Comprehensive metabolic panel     Status: Abnormal   Collection Time: 07/11/14  7:45  PM  Result Value Ref Range   Sodium 140 135 - 145 mmol/L   Potassium 4.2 3.5 - 5.1 mmol/L   Chloride 109 96 - 112 mmol/L   CO2 25 19 - 32 mmol/L   Glucose, Bld 75 70 - 99 mg/dL   BUN 10 6 - 23 mg/dL   Creatinine, Ser 7.82 0.50 - 1.10 mg/dL   Calcium 8.1 (L) 8.4 - 10.5 mg/dL   Total Protein 5.1 (L) 6.0 - 8.3 g/dL   Albumin 2.5 (L) 3.5 - 5.2 g/dL   AST 29 0 - 37 U/L   ALT 28 0 - 35 U/L   Alkaline Phosphatase 83 39 - 117 U/L   Total Bilirubin 0.3 0.3 - 1.2 mg/dL   GFR calc non Af Amer >90 >90 mL/min   GFR calc Af Amer >90 >90 mL/min   Anion gap 6 5 - 15  Urinalysis, Routine w reflex microscopic     Status: Abnormal   Collection Time: 07/11/14  7:45 PM  Result Value Ref Range   Color, Urine YELLOW YELLOW   APPearance CLEAR CLEAR   Specific Gravity, Urine 1.025 1.005 - 1.030   pH 6.0 5.0 - 8.0   Glucose, UA NEGATIVE NEGATIVE mg/dL   Hgb urine dipstick LARGE (A) NEGATIVE   Bilirubin Urine NEGATIVE NEGATIVE   Ketones, ur NEGATIVE NEGATIVE mg/dL   Protein, ur NEGATIVE NEGATIVE mg/dL   Urobilinogen, UA 0.2 0.0 - 1.0 mg/dL   Nitrite NEGATIVE NEGATIVE   Leukocytes, UA MODERATE (A) NEGATIVE  Urine microscopic-add on     Status: Abnormal   Collection Time: 07/11/14  7:45 PM  Result Value Ref Range   Squamous Epithelial / LPF FEW (A) RARE   WBC, UA 21-50 <3 WBC/hpf   Bacteria, UA FEW (A) RARE   Urine-Other MUCOUS PRESENT     Blood pressure 117/68, pulse 86, temperature 99.2 F (37.3 C), temperature source Oral, resp. rate 18, SpO2 100 %, currently breastfeeding. Today's Vitals   07/11/14 1802 07/11/14 1803 07/11/14 1806 07/11/14 1936  BP: 115/61 116/68 117/68   Pulse: 76 85 86   Temp:    97.5 F (36.4 C)  TempSrc:    Oral  Resp:      SpO2:  100% 100%   PainSc:        Physical Exam Gen: No acute distress, but Ana Hudson tired and pale in appearance. In good spirits after receiving a good report on her baby's progress Lungs: Clear to all  fields bilaterally CV: RRR; no  gallops Abd: Soft, tender at top of belly, fundus firm, +BS, obese pannus; protuberant Neg CVAT bilaterally Lochia: Scant, no odor Ext: 2+ pitting edema to legs and ankles. +pedal pulses ED Course  Orthostatics IVFs Protonix CBC w/ diff CMP UA Urine culture  Assessment: Normal labs and orthostatics. Resolution of sxs s/p IVFs and Protonix. Will await urine culture.  Plan: 1) Protonix and Simethicone 2) Urine culture 3) Return to office in 2 wks for early pp evaluation or sooner as needed  Plan of care discussed w/ Dr. Charlotta Newton.  Sherre Scarlet CNM, MS 07/11/2014 7:28 PM

## 2014-07-11 NOTE — Lactation Note (Signed)
This note was copied from the chart of Ana Bradd Burnericole Scarlata. Lactation Consultation Note  Follow up visit made with mom in the NICU.   She states she is pumping every 2 hours during the day and every 3 hours at night.  Mom states she is obtaining small amounts which is increasing daily.  Reminded mom that rest, fluids and nutrition is important.  Baby is not showing much interest in bottle yet.  Explained to mom that we are available to assist her with putting baby to breast as he shows interest.  Encouraged to call with concerns/assist.  Patient Name: Ana Hudson [id number]'s Date: 07/11/2014     Maternal Data    Feeding Feeding Type: Breast Milk with Formula added Nipple Type: Slow - flow Length of feed: 60 min  LATCH Score/Interventions                      Lactation Tools Discussed/Used     Consult Status      Huston FoleyMOULDEN, Krizia Flight S 07/11/2014, 12:00 PM

## 2014-07-11 NOTE — MAU Note (Signed)
Had vag del 01/22.  Required blood transfusion.  Was in the NICU, became hot, started feeling dizzy and light headed. Thought she should get checked out.

## 2014-07-11 NOTE — Discharge Instructions (Signed)
Constipation Constipation is when a person: Poops (has a bowel movement) less than 3 times a week. Has a hard time pooping. Has poop that is dry, hard, or bigger than normal. HOME CARE  Eat foods with a lot of fiber in them. This includes fruits, vegetables, beans, and whole grains such as brown rice. Avoid fatty foods and foods with a lot of sugar. This includes french fries, hamburgers, cookies, candy, and soda. If you are not getting enough fiber from food, take products with added fiber in them (supplements). Drink enough fluid to keep your pee (urine) clear or pale yellow. Exercise on a regular basis, or as told by your doctor. Go to the restroom when you feel like you need to poop. Do not hold it. Only take medicine as told by your doctor. Do not take medicines that help you poop (laxatives) without talking to your doctor first. GET HELP RIGHT AWAY IF:  You have bright red blood in your poop (stool). Your constipation lasts more than 4 days or gets worse. You have belly (abdominal) or butt (rectal) pain. You have thin poop (as thin as a pencil). You lose weight, and it cannot be explained. MAKE SURE YOU:  Understand these instructions. Will watch your condition. Will get help right away if you are not doing well or get worse. Document Released: 11/20/2007 Document Revised: 06/08/2013 Document Reviewed: 03/15/2013 [facility] Patient Information 2015 [place], [place]. This information is not intended to replace advice given to you by your health care provider. Make sure you discuss any questions you have with your health care provider. Edema Edema is an abnormal buildup of fluids in your bodytissues. Edema is somewhatdependent on gravity to pull the fluid to the lowest place in your body. That makes the condition more common in the legs and thighs (lower extremities). Painless swelling of the feet and ankles is common and becomes more likely as you get older. It is also common in looser  tissues, like around your eyes.  When the affected area is squeezed, the fluid may move out of that spot and leave a dent for a few moments. This dent is called pitting.  CAUSES  There are many possible causes of edema. Eating too much salt and being on your feet or sitting for a long time can cause edema in your legs and ankles. Hot weather may make edema worse. Common medical causes of edema include: Heart failure. Liver disease. Kidney disease. Weak blood vessels in your legs. Cancer. An injury. Pregnancy. Some medications. Obesity. SYMPTOMS  Edema is usually painless.Your skin may look swollen or shiny.  DIAGNOSIS  Your health care provider may be able to diagnose edema by asking about your medical history and doing a physical exam. You may need to have tests such as X-rays, an electrocardiogram, or blood tests to check for medical conditions that may cause edema.  TREATMENT  Edema treatment depends on the cause. If you have heart, liver, or kidney disease, you need the treatment appropriate for these conditions. General treatment may include: Elevation of the affected body part above the level of your heart. Compression of the affected body part. Pressure from elastic bandages or support stockings squeezes the tissues and forces fluid back into the blood vessels. This keeps fluid from entering the tissues. Restriction of fluid and salt intake. Use of a water pill (diuretic). These medications are appropriate only for some types of edema. They pull fluid out of your body and make you urinate more often.  This gets rid of fluid and reduces swelling, but diuretics can have side effects. Only use diuretics as directed by your health care provider. HOME CARE INSTRUCTIONS  Keep the affected body part above the level of your heart when you are lying down.  Do not sit still or stand for prolonged periods.  Do not put anything directly under your knees when lying down. Do not wear  constricting clothing or garters on your upper legs.  Exercise your legs to work the fluid back into your blood vessels. This may help the swelling go down.  Wear elastic bandages or support stockings to reduce ankle swelling as directed by your health care provider.  Eat a low-salt diet to reduce fluid if your health care provider recommends it.  Only take medicines as directed by your health care provider. SEEK MEDICAL CARE IF:  Your edema is not responding to treatment. You have heart, liver, or kidney disease and notice symptoms of edema. You have edema in your legs that does not improve after elevating them.  You have sudden and unexplained weight gain. SEEK IMMEDIATE MEDICAL CARE IF:  You develop shortness of breath or chest pain.  You cannot breathe when you lie down. You develop pain, redness, or warmth in the swollen areas.  You have heart, liver, or kidney disease and suddenly get edema. You have a fever and your symptoms suddenly get worse. MAKE SURE YOU:  Understand these instructions. Will watch your condition. Will get help right away if you are not doing well or get worse. Document Released: 06/03/2005 Document Revised: 10/18/2013 Document Reviewed: 03/26/2013 [facility] Patient Information 2015 [place], [place]. This information is not intended to replace advice given to you by your health care provider. Make sure you discuss any questions you have with your health care provider. Dehydration, Adult Dehydration is when you lose more fluids from the body than you take in. Vital organs like the kidneys, brain, and heart cannot function without a proper amount of fluids and salt. Any loss of fluids from the body can cause dehydration.  CAUSES  Vomiting. Diarrhea. Excessive sweating. Excessive urine output. Fever. SYMPTOMS  Mild dehydration Thirst. Dry lips. Slightly dry mouth. Moderate dehydration Very dry mouth. Sunken eyes. Skin does not bounce back quickly  when lightly pinched and released. Dark urine and decreased urine production. Decreased tear production. Headache. Severe dehydration Very dry mouth. Extreme thirst. Rapid, weak pulse (more than 100 beats per minute at rest). Cold hands and feet. Not able to sweat in spite of heat and temperature. Rapid breathing. Blue lips. Confusion and lethargy. Difficulty being awakened. Minimal urine production. No tears. DIAGNOSIS  Your caregiver will diagnose dehydration based on your symptoms and your exam. Blood and urine tests will help confirm the diagnosis. The diagnostic evaluation should also identify the cause of dehydration. TREATMENT  Treatment of mild or moderate dehydration can often be done at home by increasing the amount of fluids that you drink. It is best to drink small amounts of fluid more often. Drinking too much at one time can make vomiting worse. Refer to the home care instructions below. Severe dehydration needs to be treated at the hospital where you will probably be given intravenous (IV) fluids that contain water and electrolytes. HOME CARE INSTRUCTIONS  Ask your caregiver about specific rehydration instructions. Drink enough fluids to keep your urine clear or pale yellow. Drink small amounts frequently if you have nausea and vomiting. Eat as you normally do. Avoid: Foods or drinks high  in sugar. Carbonated drinks. Juice. Extremely hot or cold fluids. Drinks with caffeine. Fatty, greasy foods. Alcohol. Tobacco. Overeating. Gelatin desserts. Wash your hands well to avoid spreading bacteria and viruses. Only take over-the-counter or prescription medicines for pain, discomfort, or fever as directed by your caregiver. Ask your caregiver if you should continue all prescribed and over-the-counter medicines. Keep all follow-up appointments with your caregiver. SEEK MEDICAL CARE IF: You have abdominal pain and it increases or stays in one area (localizes). You have  a rash, stiff neck, or severe headache. You are irritable, sleepy, or difficult to awaken. You are weak, dizzy, or extremely thirsty. SEEK IMMEDIATE MEDICAL CARE IF:  You are unable to keep fluids down or you get worse despite treatment. You have frequent episodes of vomiting or diarrhea. You have blood or green matter (bile) in your vomit. You have blood in your stool or your stool looks black and tarry. You have not urinated in 6 to 8 hours, or you have only urinated a small amount of very dark urine. You have a fever. You faint. MAKE SURE YOU:  Understand these instructions. Will watch your condition. Will get help right away if you are not doing well or get worse. Document Released: 06/03/2005 Document Revised: 08/26/2011 Document Reviewed: 01/21/2011 [facility] Patient Information 2015 [place], [place]. This information is not intended to replace advice given to you by your health care provider. Make sure you discuss any questions you have with your health care provider. Breast Pumping Tips If you are breastfeeding, there may be times when you cannot feed your baby directly. Returning to work or going on a trip are common examples. Pumping allows you to store breast milk and feed it to your baby later.  You may not get much milk when you first start to pump. Your breasts should start to make more after a few days. If you pump at the times you usually feed your baby, you may be able to keep making enough milk to feed your baby without also using formula. The more often you pump, the more milk you will produce. WHEN SHOULD I PUMP?  You can begin to pump soon after delivery. However, some experts recommend waiting about 4 weeks before giving your infant a bottle to make sure breastfeeding is going well. If you plan to return to work, begin pumping a few weeks before. This will help you develop techniques that work best for you. It also lets you build up a supply of breast milk.  When you are  with your infant, feed on demand and pump after each feeding.  When you are away from your infant for several hours, pump for about 15 minutes every 2-3 hours. Pump both breasts at the same time if you can.  If your infant has a formula feeding, make sure to pump around the same time.  If you drink any alcohol, wait 2 hours before pumping.  HOW DO I PREPARE TO PUMP? Your let-down reflexis the natural reaction to stimulation that makes your breast milk flow. It is easier to stimulate this reflex when you are relaxed. Find relaxation techniques that work for you. If you have difficulty with your let-down reflex, try these methods:  Smell one of your infant's blankets or an item of clothing.  Look at a picture or video of your infant.  Sit in a quiet, private space.  Massage the breast you plan to pump.  Place soothing warmth on the breast.  Play relaxing music.  WHAT ARE SOME GENERAL BREAST PUMPING TIPS? Wash your hands before you pump. You do not need to wash your nipples or breasts. There are three ways to pump. You can use your hand to massage and compress your breast. You can use a handheld manual pump. You can use an electric pump.  Make sure the suction cup (flange) on the breast pump is the right size. Place the flange directly over the nipple. If it is the wrong size or placed the wrong way, it may be painful and cause nipple damage.  If pumping is uncomfortable, apply a small amount of purified or modified lanolin to your nipple and areola. If you are using an electric pump, adjust the speed and suction power to be more comfortable. If pumping is painful or if you are not getting very much milk, you may need a different type of pump. A lactation consultant can help you determine what type of pump to use.  Keep a full water bottle near you at all times. Drinking lots of fluid helps you make more milk. You can store your milk to use later. Pumped breast milk can be stored  in a sealable, sterile container or plastic bag. Label all stored breast milk with the date you pumped it. Milk can stay out at room temperature for up to 8 hours. You can store your milk in the refrigerator for up to 8 days. You can store your milk in the freezer for 3 months. Thaw frozen milk using warm water. Do not put it in the microwave. Do not smoke. Smoking can lower your milk supply and harm your infant. If you need help quitting, ask your health care provider to recommend a program.  WHEN SHOULD I CALL MY HEALTH CARE PROVIDER OR A LACTATION CONSULTANT? You are having trouble pumping. You are concerned that you are not making enough milk. You have nipple pain, soreness, or redness. You want to use birth control. Birth control pills may lower your milk supply. Talk to your health care provider about your options. Document Released: 11/21/2009 Document Revised: 06/08/2013 Document Reviewed: 03/26/2013 [facility] Patient Information 2015 [place], [place]. This information is not intended to replace advice given to you by your health care provider. Make sure you discuss any questions you have with your health care provider. Postpartum Care After Vaginal Delivery After you deliver your newborn (postpartum period), the usual stay in the hospital is 24-72 hours. If there were problems with your labor or delivery, or if you have other medical problems, you might be in the hospital longer.  While you are in the hospital, you will receive help and instructions on how to care for yourself and your newborn during the postpartum period.  While you are in the hospital:  Be sure to tell your nurses if you have pain or discomfort, as well as where you feel the pain and what makes the pain worse.  If you had an incision made near your vagina (episiotomy) or if you had some tearing during delivery, the nurses may put ice packs on your episiotomy or tear. The ice packs may help to reduce the pain and  swelling.  If you are breastfeeding, you may feel uncomfortable contractions of your uterus for a couple of weeks. This is normal. The contractions help your uterus get back to normal size.  It is normal to have some bleeding after delivery.  For the first 1-3 days after delivery, the flow is red and the amount may be  similar to a period.  It is common for the flow to start and stop.  In the first few days, you may pass some small clots. Let your nurses know if you begin to pass large clots or your flow increases.  Do not  flush blood clots down the toilet before having the nurse look at them.  During the next 3-10 days after delivery, your flow should become more watery and pink or brown-tinged in color.  Ten to fourteen days after delivery, your flow should be a small amount of yellowish-white discharge.  The amount of your flow will decrease over the first few weeks after delivery. Your flow may stop in 6-8 weeks. Most women have had their flow stop by 12 weeks after delivery.  You should change your sanitary pads frequently.  Wash your hands thoroughly with soap and water for at least 20 seconds after changing pads, using the toilet, or before holding or feeding your newborn.  You should feel like you need to empty your bladder within the first 6-8 hours after delivery.  In case you become weak, lightheaded, or faint, call your nurse before you get out of bed for the first time and before you take a shower for the first time.  Within the first few days after delivery, your breasts may begin to feel tender and full. This is called engorgement. Breast tenderness usually goes away within 48-72 hours after engorgement occurs. You may also notice milk leaking from your breasts. If you are not breastfeeding, do not stimulate your breasts. Breast stimulation can make your breasts produce more milk.  Spending as much time as possible with your newborn is very important. During this time,  you and your newborn can feel close and get to know each other. Having your newborn stay in your room (rooming in) will help to strengthen the bond with your newborn. It will give you time to get to know your newborn and become comfortable caring for your newborn.  Your hormones change after delivery. Sometimes the hormone changes can temporarily cause you to feel sad or tearful. These feelings should not last more than a few days. If these feelings last longer than that, you should talk to your caregiver.  If desired, talk to your caregiver about methods of family planning or contraception.  Talk to your caregiver about immunizations. Your caregiver may want you to have the following immunizations before leaving the hospital:  Tetanus, diphtheria, and pertussis (Tdap) or tetanus and diphtheria (Td) immunization. It is very important that you and your family (including grandparents) or others caring for your newborn are up-to-date with the Tdap or Td immunizations. The Tdap or Td immunization can help protect your newborn from getting ill.  Rubella immunization.  Varicella (chickenpox) immunization.  Influenza immunization. You should receive this annual immunization if you did not receive the immunization during your pregnancy. Document Released: 03/31/2007 Document Revised: 02/26/2012 Document Reviewed: 01/29/2012 [facility] Patient Information 2015 [place], [place]. This information is not intended to replace advice given to you by your health care provider. Make sure you discuss any questions you have with your health care provider. Heartburn Heartburn is a painful, burning sensation in the chest. It may feel worse in certain positions, such as lying down or bending over. It is caused by stomach acid backing up into the tube that carries food from the mouth down to the stomach (lower esophagus).  CAUSES   Large meals.  Certain foods and drinks.  Exercise.  Increased  acid  production.  Being overweight or obese.  Certain medicines. SYMPTOMS   Burning pain in the chest or lower throat.  Bitter taste in the mouth.  Coughing. DIAGNOSIS  If the usual treatments for heartburn do not improve your symptoms, then tests may be done to see if there is another condition present. Possible tests may include:  X-rays.  Endoscopy. This is when a tube with a light and a camera on the end is used to examine the esophagus and the stomach.  A test to measure the amount of acid in the esophagus (pH test).  A test to see if the esophagus is working properly (esophageal manometry).  Blood, breath, or stool tests to check for bacteria that cause ulcers. TREATMENT   Your caregiver may tell you to use certain over-the-counter medicines (antacids, acid reducers) for mild heartburn.  Your caregiver may prescribe medicines to decrease the acid in your stomach or protect your stomach lining.  Your caregiver may recommend certain diet changes.  For severe cases, your caregiver may recommend that the head of your bed be elevated on blocks. (Sleeping with more pillows is not an effective treatment as it only changes the position of your head and does not improve the main problem of stomach acid refluxing into the esophagus.) HOME CARE INSTRUCTIONS   Take all medicines as directed by your caregiver.  Raise the head of your bed by putting blocks under the legs if instructed to by your caregiver.  Do not exercise right after eating.  Avoid eating 2 or 3 hours before bed. Do not lie down right after eating.  Eat small meals throughout the day instead of 3 large meals.  Stop smoking if you smoke.  Maintain a healthy weight.  Identify foods and beverages that make your symptoms worse and avoid them. Foods you may want to avoid include:  Peppers.  Chocolate.  High-fat foods, including fried foods.  Spicy foods.  Garlic and onions.  Citrus fruits, including  oranges, grapefruit, lemons, and limes.  Food containing tomatoes or tomato products.  Mint.  Carbonated drinks, caffeinated drinks, and alcohol.  Vinegar. SEEK IMMEDIATE MEDICAL CARE IF:  You have severe chest pain that goes down your arm or into your jaw or neck.  You feel sweaty, dizzy, or lightheaded.  You are short of breath.  You vomit blood.  You have difficulty or pain with swallowing.  You have bloody or black, tarry stools.  You have episodes of heartburn more than 3 times a week for more than 2 weeks. MAKE SURE YOU:  Understand these instructions.  Will watch your condition.  Will get help right away if you are not doing well or get worse. Document Released: 10/20/2008 Document Revised: 08/26/2011 Document Reviewed: 11/18/2010 [facility] Patient Information 2015 [place], [place]. This information is not intended to replace advice given to you by your health care provider. Make sure you discuss any questions you have with your health care provider.

## 2014-07-11 NOTE — MAU Note (Signed)
Pt states that dizziness and light headedness have resolved.

## 2014-07-12 LAB — PREPARE FRESH FROZEN PLASMA
UNIT DIVISION: 0
UNIT DIVISION: 0
Unit division: 0
Unit division: 0

## 2014-07-13 LAB — URINE CULTURE: Colony Count: >=100000

## 2014-07-15 ENCOUNTER — Ambulatory Visit: Payer: Self-pay

## 2014-07-15 NOTE — Lactation Note (Signed)
This note was copied from the chart of Ana Bradd Burnericole Holthaus. Lactation Consultation Note  Patient Name: Ana Hudson ZOXWR'UToday's Date: 07/15/2014 Reason for consult: Follow-up assessment NICU baby, 7 days of life. Observed mom latch baby to right breast in cross-cradle position. Mom able to latch baby without assistance. Baby latches deeply and suckles rhythmically with a few swallows noted. Pre- and post-weight performed and baby transferred 16g of breast milk. Mom enc to begin each breast feed on alternate breast and discussed hind milk with increased fat content. Discussed feeding cues with mom as well.   Maternal Data    Feeding Feeding Type: Breast Fed Length of feed: 30 min  LATCH Score/Interventions Latch: Grasps breast easily, tongue down, lips flanged, rhythmical sucking.  Audible Swallowing: A few with stimulation Intervention(s): Skin to skin;Hand expression  Type of Nipple: Everted at rest and after stimulation  Comfort (Breast/Nipple): Soft / non-tender     Hold (Positioning): Assistance needed to correctly position infant at breast and maintain latch. Intervention(s): Breastfeeding basics reviewed;Support Pillows  LATCH Score: 8  Lactation Tools Discussed/Used     Consult Status Consult Status: Follow-up Date: 07/16/14 Follow-up type: In-patient    Geralynn OchsWILLIARD, Shandon Matson 07/15/2014, 6:21 PM

## 2014-07-15 NOTE — Lactation Note (Signed)
This note was copied from the chart of Boy Bradd Burnericole Ferre. Lactation Consultation Note Follow up visit for feeding assist in NICU.  Mom has baby positioned in cross cradle hold on left side.  Baby opened wide and latched easily.  Baby nursed well for 20 minutes with few audible swallows.  Demonstrated waking techniques and breast massage to parents to assist with feeding.  Baby came off first side and latched easily to opposite breast with ease.  Baby nursed for additional 10 minutes and came off breast relaxed.  Baby transferred 16 mls at breast.  Discussed pumping with mom and she has increased frequency to every 2-3 hours.  She is obtaining 20-30 mls combined at each pumping.  Praised mom for her efforts and encouraged to continue pumping on a frequent basis.  Patient Name: Boy Bradd Burnericole Porcaro [id number]'s Date: 07/15/2014 Reason for consult: Follow-up assessment   Maternal Data    Feeding Feeding Type: Breast Fed Nipple Type: Slow - flow Length of feed: 15 min  LATCH Score/Interventions Latch: Grasps breast easily, tongue down, lips flanged, rhythmical sucking.  Audible Swallowing: A few with stimulation Intervention(s): Hand expression;Alternate breast massage  Type of Nipple: Everted at rest and after stimulation  Comfort (Breast/Nipple): Soft / non-tender     Hold (Positioning): Assistance needed to correctly position infant at breast and maintain latch. Intervention(s): Breastfeeding basics reviewed;Support Pillows;Position options  LATCH Score: 8  Lactation Tools Discussed/Used     Consult Status      Huston FoleyMOULDEN, Claud Gowan S 07/15/2014, 1:37 PM

## 2014-07-15 NOTE — Lactation Note (Incomplete Revision)
This note was copied from the chart of Ana Bradd Burnericole Schlechter. Lactation Consultation Note  Patient Name: Ana Hudson QMVHQ'IToday's Date: 07/15/2014 Reason for consult: Follow-up assessment NICU baby, 7 days of life. Observed mom latch baby to right breast in cross-cradle position. Mom able to latch baby without assistance. Baby latches deeply and suckles rhythmically with a few swallows noted. Pre- and post-weight performed and baby transferred 16g of breast milk. Mom enc to begin each breast feed on alternate breast and discussed hind milk with increased fat content. Discussed feeding cues with mom as well. Mom reports that she is getting increased amounts of EBM when she used DEBP after nursing the baby.   Maternal Data    Feeding Feeding Type: Breast Fed Length of feed: 30 min  LATCH Score/Interventions Latch: Grasps breast easily, tongue down, lips flanged, rhythmical sucking.  Audible Swallowing: A few with stimulation Intervention(s): Skin to skin;Hand expression  Type of Nipple: Everted at rest and after stimulation  Comfort (Breast/Nipple): Soft / non-tender     Hold (Positioning): Assistance needed to correctly position infant at breast and maintain latch. Intervention(s): Breastfeeding basics reviewed;Support Pillows  LATCH Score: 8  Lactation Tools Discussed/Used     Consult Status Consult Status: Follow-up Date: 07/16/14 Follow-up type: In-patient    Geralynn OchsWILLIARD, Ana Hudson 07/15/2014, 6:21 PM

## 2014-07-16 ENCOUNTER — Inpatient Hospital Stay (HOSPITAL_COMMUNITY)
Admission: AD | Admit: 2014-07-16 | Discharge: 2014-07-16 | Disposition: A | Discharge status: Home / Self Care | Payer: Medicaid Other | Source: Ambulatory Visit | Attending: Obstetrics and Gynecology | Admitting: Obstetrics and Gynecology

## 2014-07-16 ENCOUNTER — Encounter (HOSPITAL_COMMUNITY): Payer: Self-pay

## 2014-07-16 DIAGNOSIS — O9089 Other complications of the puerperium, not elsewhere classified: Secondary | ICD-10-CM | POA: Diagnosis not present

## 2014-07-16 DIAGNOSIS — Z87891 Personal history of nicotine dependence: Secondary | ICD-10-CM | POA: Insufficient documentation

## 2014-07-16 DIAGNOSIS — K5900 Constipation, unspecified: Secondary | ICD-10-CM | POA: Diagnosis present

## 2014-07-16 MED ORDER — FLEET ENEMA 7-19 GM/118ML RE ENEM: 1.0000 | ENEMA | Freq: Every day | RECTAL | Status: DC | PRN

## 2014-07-16 NOTE — Discharge Instructions (Signed)

## 2014-07-16 NOTE — MAU Note (Signed)
Pt states vaginal delivery 07/08/2014, no bowel mvmt in at least 4 days. Has not tried using an enema. Has been taking gas-x and laxative with no results.

## 2014-07-16 NOTE — MAU Provider Note (Signed)
Ana Hudson is a 24 y.o. G2P1001 non pregnant SP SVD 07/08/14.  She c/o no BMx 4 days.  She has been taking gas x and senokot with no results  History     Patient Active Problem List   Diagnosis Date Noted  . Vaginal delivery 07/08/2014  . Postpartum hemorrhage 11/06/2014  . Shoulder dystocia 07/08/2014  . Chorioamnionitis 07/08/2014  . Severe obesity (BMI >= 40) 07/07/2014  . IBS (irritable bowel syndrome) - constipation and diarrhea 07/07/2014  . 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 Simple febrile seizure - at age 26 months, then again at 24 years of age 11/05/2014  . Clomid pregnancy 07/07/2014    Chief Complaint  Patient presents with  . Constipation   HPI  OB History    Gravida Para Term Preterm AB TAB SAB Ectopic Multiple Living   2 1 1  1  1   0 1      Past Medical History  Diagnosis Date  . Seizures   . Anemia   . Heart murmur   . Postpartum hemorrhage     Past Surgical History  Procedure Laterality Date  . Tonsillectomy and adenoidectomy    . Addnoids      History reviewed. No pertinent family history.  History  Substance Use Topics  . Smoking status: Former Games developermoker  . Smokeless tobacco: Never Used  . Alcohol Use: Yes     Comment: rare when not pregnant    Allergies:  Allergies  Allergen Reactions  . Nickel Rash    Prescriptions prior to admission  Medication Sig Dispense Refill Last Dose  . ferrous sulfate 325 (65 FE) MG tablet Take 1 tablet (325 mg total) by mouth 3 (three) times daily with meals. Take TID WC for 2 weeks then reduce to BID WC for 4 weeks. 90 tablet 3 07/16/2014 at Unknown time  . ibuprofen (ADVIL,MOTRIN) 600 MG tablet Take 1 tablet (600 mg total) by mouth every 6 (six) hours. 30 tablet 2 07/15/2014 at Unknown time  . oxyCODONE-acetaminophen (PERCOCET/ROXICET) 5-325 MG per tablet Take 1-2 tablets by mouth every 6 (six) hours as needed (for pain scale equal to or greater than 7). 30 tablet 0 07/16/2014  at Unknown time  . pantoprazole (PROTONIX) 40 MG tablet Take 1 tablet (40 mg total) by mouth daily as needed (Heartburn, indigestion). 60 tablet 2 07/15/2014 at Unknown time  . Prenatal Vit-Fe Fumarate-FA (PRENATAL MULTIVITAMIN) TABS tablet Take 1 tablet by mouth daily at 12 noon.   07/15/2014 at Unknown time  . senna-docusate (SENOKOT-S) 8.6-50 MG per tablet Take 2 tablets by mouth at bedtime as needed for mild constipation. 30 tablet 0 07/15/2014 at Unknown time  . simethicone (MYLICON) 80 MG chewable tablet Chew 1 tablet (80 mg total) by mouth 4 (four) times daily as needed for flatulence. 30 tablet 0 07/15/2014 at Unknown time    ROS See HPI above, all other systems are negative  Physical Exam   Blood pressure 113/65, pulse 70, temperature 97.4 F (36.3 C), temperature source Oral, resp. rate 16, height 5\' 8"  (1.727 m), weight 256 lb (116.121 kg), currently breastfeeding.  Physical Exam Ext:  WNL ABD: Soft, non tender to palpation, no rebound or guarding    ED Course  Assessment: constipation   Plan: DC to home with RX for fleet enema   Danean Marner, CNM, MSN 07/16/2014. 8:38 AM

## 2014-07-17 ENCOUNTER — Ambulatory Visit: Payer: Self-pay

## 2014-07-17 NOTE — Lactation Note (Signed)
This note was copied from the chart of Ana Hudson. Lactation Consultation Note  Patient Name: Ana Bradd Burnericole Bessent ZOXWR'UToday's Date: 07/17/2014 Reason for consult: Follow-up assessment;NICU baby  Asked to come see Mom before baby being discharged following rooming in last night.  Baby 589 days old, Mom has an adequate milk supply, double pumping 2-3 oz per pumping.  Mom states that AJ wouldn't stay on the breast for longer than a few minutes, so they supplemented with breast milk by bottle 35-50 ml.  Left nipple very sore.  Assisted with positioning and latching of AJ.  With greater depth on the breast, AJ stayed latched on for 15 minutes on first breast, pc weight 40 ml.  Mom able to latch baby independently now, and says she is more comfortable with the feeding.  Nipple rounded, not pinched when baby came off after feeding.  Encouraged feeding baby skin to skin.  Encouraged frequent breast feeding on cue.  Recommended pumping after breast feeding every other time and storing any pumped milk in freezer/refrigerator, to support her milk supply.  Basic breast feeding education reviewed with Mom and FOB.  [facility] within 2-3 days.  Mom knows to call us prn.  Reminded Mom of OP lactation support offered to her.  Mom very encouraged with breast feeding at present.  Consult Status Consult Status: Complete Date: 07/17/14 Follow-up type: Call as needed    Judee ClaraSmith, Tymira Horkey E 07/17/2014, 9:46 AM

## 2014-07-29 ENCOUNTER — Other Ambulatory Visit: Payer: Self-pay | Admitting: Obstetrics and Gynecology

## 2014-07-29 ENCOUNTER — Encounter (HOSPITAL_COMMUNITY): Payer: Self-pay | Admitting: *Deleted

## 2014-07-29 ENCOUNTER — Observation Stay (HOSPITAL_COMMUNITY)
Admission: EM | Admit: 2014-07-29 | Discharge: 2014-07-31 | Disposition: A | Discharge status: Home / Self Care | Payer: Medicaid Other | Source: Ambulatory Visit | Attending: Obstetrics and Gynecology | Admitting: Obstetrics and Gynecology

## 2014-07-29 ENCOUNTER — Ambulatory Visit (HOSPITAL_COMMUNITY): Payer: Medicaid Other | Admitting: Anesthesiology

## 2014-07-29 ENCOUNTER — Ambulatory Visit (HOSPITAL_COMMUNITY): Payer: Medicaid Other

## 2014-07-29 ENCOUNTER — Encounter (HOSPITAL_COMMUNITY)
Admission: EM | Disposition: A | Discharge status: Home / Self Care | Payer: Self-pay | Source: Ambulatory Visit | Attending: Obstetrics and Gynecology

## 2014-07-29 DIAGNOSIS — D649 Anemia, unspecified: Secondary | ICD-10-CM | POA: Insufficient documentation

## 2014-07-29 DIAGNOSIS — Z87891 Personal history of nicotine dependence: Secondary | ICD-10-CM | POA: Insufficient documentation

## 2014-07-29 DIAGNOSIS — Z3A00 Weeks of gestation of pregnancy not specified: Secondary | ICD-10-CM | POA: Diagnosis not present

## 2014-07-29 DIAGNOSIS — N939 Abnormal uterine and vaginal bleeding, unspecified: Secondary | ICD-10-CM

## 2014-07-29 DIAGNOSIS — K589 Irritable bowel syndrome without diarrhea: Secondary | ICD-10-CM | POA: Diagnosis not present

## 2014-07-29 HISTORY — PX: DILATION AND CURETTAGE OF UTERUS: SHX78

## 2014-07-29 LAB — CBC
HEMATOCRIT: 32.9 % — AB (ref 36.0–46.0)
HEMOGLOBIN: 10.6 g/dL — AB (ref 12.0–15.0)
MCH: 28.6 pg (ref 26.0–34.0)
MCHC: 32.2 g/dL (ref 30.0–36.0)
MCV: 88.9 fL (ref 78.0–100.0)
Platelets: 271 10*3/uL (ref 150–400)
RBC: 3.7 MIL/uL — AB (ref 3.87–5.11)
RDW: 14.7 % (ref 11.5–15.5)
WBC: 10 10*3/uL (ref 4.0–10.5)

## 2014-07-29 SURGERY — DILATION AND CURETTAGE
Anesthesia: General

## 2014-07-29 MED ORDER — FENTANYL CITRATE 0.05 MG/ML IJ SOLN
INTRAMUSCULAR | Status: DC | PRN
  Administered 2014-07-29 (×2): 50 ug via INTRAVENOUS

## 2014-07-29 MED ORDER — ACETAMINOPHEN 160 MG/5ML PO SOLN: 650.0000 mg | Freq: Once | ORAL | Status: DC

## 2014-07-29 MED ORDER — LACTATED RINGERS IV SOLN
INTRAVENOUS | Status: DC | PRN
  Administered 2014-07-29: 13:00:00 via INTRAVENOUS

## 2014-07-29 MED ORDER — PANTOPRAZOLE SODIUM 40 MG PO TBEC: 40.0000 mg | DELAYED_RELEASE_TABLET | Freq: Every day | ORAL | Status: DC | PRN

## 2014-07-29 MED ORDER — OXYTOCIN 10 UNIT/ML IJ SOLN
40.0000 [IU] | INTRAMUSCULAR | Status: DC | PRN
  Administered 2014-07-29: 40 [IU] via INTRAVENOUS

## 2014-07-29 MED ORDER — LACTATED RINGERS IV SOLN
INTRAVENOUS | Status: DC
  Administered 2014-07-29 (×2): via INTRAVENOUS

## 2014-07-29 MED ORDER — ONDANSETRON HCL 4 MG/2ML IJ SOLN: 4.0000 mg | Freq: Once | INTRAMUSCULAR | Status: DC | PRN

## 2014-07-29 MED ORDER — LACTATED RINGERS IV SOLN
INTRAVENOUS | Status: DC
  Administered 2014-07-29 – 2014-07-30 (×3): via INTRAVENOUS

## 2014-07-29 MED ORDER — LIDOCAINE HCL (CARDIAC) 20 MG/ML IV SOLN
INTRAVENOUS | Status: DC | PRN
  Administered 2014-07-29: 80 mg via INTRAVENOUS

## 2014-07-29 MED ORDER — MIDAZOLAM HCL 2 MG/2ML IJ SOLN
INTRAMUSCULAR | Status: DC | PRN
  Administered 2014-07-29: 2 mg via INTRAVENOUS

## 2014-07-29 MED ORDER — FENTANYL CITRATE 0.05 MG/ML IJ SOLN
INTRAMUSCULAR | Status: AC
  Administered 2014-07-29: 50 ug via INTRAVENOUS
  Filled 2014-07-29: qty 2

## 2014-07-29 MED ORDER — FERROUS SULFATE 325 (65 FE) MG PO TABS
325.0000 mg | ORAL_TABLET | Freq: Three times a day (TID) | ORAL | Status: DC
  Administered 2014-07-29 – 2014-07-31 (×6): 325 mg via ORAL
  Filled 2014-07-29 (×6): qty 1

## 2014-07-29 MED ORDER — MISOPROSTOL 200 MCG PO TABS
ORAL_TABLET | ORAL | Status: AC
  Filled 2014-07-29: qty 5

## 2014-07-29 MED ORDER — MISOPROSTOL 100 MCG PO TABS
ORAL_TABLET | ORAL | Status: DC | PRN
  Administered 2014-07-29: 1000 ug

## 2014-07-29 MED ORDER — PRENATAL MULTIVITAMIN CH
1.0000 | ORAL_TABLET | Freq: Every day | ORAL | Status: DC
  Administered 2014-07-30 – 2014-07-31 (×2): 1 via ORAL
  Filled 2014-07-29 (×2): qty 1

## 2014-07-29 MED ORDER — CHLOROPROCAINE HCL 1 % IJ SOLN
INTRAMUSCULAR | Status: DC | PRN
  Administered 2014-07-29: 10 mL

## 2014-07-29 MED ORDER — OXYTOCIN 40 UNITS IN LACTATED RINGERS INFUSION - SIMPLE MED: 125.0000 mL/h | INTRAVENOUS | Status: DC

## 2014-07-29 MED ORDER — DEXAMETHASONE SODIUM PHOSPHATE 10 MG/ML IJ SOLN
INTRAMUSCULAR | Status: DC | PRN
  Administered 2014-07-29: 10 mg via INTRAVENOUS

## 2014-07-29 MED ORDER — IBUPROFEN 600 MG PO TABS
600.0000 mg | ORAL_TABLET | Freq: Four times a day (QID) | ORAL | Status: DC
  Administered 2014-07-29 – 2014-07-31 (×8): 600 mg via ORAL
  Filled 2014-07-29 (×8): qty 1

## 2014-07-29 MED ORDER — PROPOFOL 10 MG/ML IV BOLUS
INTRAVENOUS | Status: DC | PRN
  Administered 2014-07-29: 200 mg via INTRAVENOUS

## 2014-07-29 MED ORDER — NITROFURANTOIN MONOHYD MACRO 100 MG PO CAPS
100.0000 mg | ORAL_CAPSULE | Freq: Two times a day (BID) | ORAL | Status: DC
  Administered 2014-07-29 – 2014-07-31 (×5): 100 mg via ORAL
  Filled 2014-07-29 (×6): qty 1

## 2014-07-29 MED ORDER — CARBOPROST TROMETHAMINE 250 MCG/ML IM SOLN
INTRAMUSCULAR | Status: DC | PRN
  Administered 2014-07-29: 250 ug via INTRAMUSCULAR

## 2014-07-29 MED ORDER — ONDANSETRON HCL 4 MG/2ML IJ SOLN
INTRAMUSCULAR | Status: DC | PRN
  Administered 2014-07-29: 4 mg via INTRAVENOUS

## 2014-07-29 MED ORDER — SENNOSIDES-DOCUSATE SODIUM 8.6-50 MG PO TABS: 2.0000 | ORAL_TABLET | Freq: Every evening | ORAL | Status: DC | PRN

## 2014-07-29 MED ORDER — FENTANYL CITRATE 0.05 MG/ML IJ SOLN
25.0000 ug | INTRAMUSCULAR | Status: DC | PRN
  Administered 2014-07-29: 50 ug via INTRAVENOUS

## 2014-07-29 MED ORDER — OXYCODONE-ACETAMINOPHEN 5-325 MG PO TABS
1.0000 | ORAL_TABLET | Freq: Four times a day (QID) | ORAL | Status: DC | PRN
  Administered 2014-07-29 – 2014-07-31 (×6): 1 via ORAL
  Filled 2014-07-29 (×6): qty 1

## 2014-07-29 MED ORDER — SIMETHICONE 80 MG PO CHEW: 80.0000 mg | CHEWABLE_TABLET | Freq: Four times a day (QID) | ORAL | Status: DC | PRN

## 2014-07-29 MED ORDER — CEFAZOLIN SODIUM-DEXTROSE 2-3 GM-% IV SOLR
2.0000 g | Freq: Once | INTRAVENOUS | Status: AC
  Administered 2014-07-29: 2 g via INTRAVENOUS

## 2014-07-29 SURGICAL SUPPLY — 18 items
BAG URINE DRAINAGE (UROLOGICAL SUPPLIES) ×2 IMPLANT
CATH FOLEY 2WAY SLVR 18FR 30CC (CATHETERS) ×2 IMPLANT
CATH ROBINSON RED A/P 16FR (CATHETERS) ×3 IMPLANT
CLOTH BEACON ORANGE TIMEOUT ST (SAFETY) ×3 IMPLANT
CONTAINER PREFILL 10% NBF 60ML (FORM) ×6 IMPLANT
GAUZE SPONGE 4X4 16PLY XRAY LF (GAUZE/BANDAGES/DRESSINGS) ×4 IMPLANT
GLOVE BIOGEL PI IND STRL 7.0 (GLOVE) ×1 IMPLANT
GLOVE BIOGEL PI INDICATOR 7.0 (GLOVE) ×6
GOWN STRL REUS W/TWL LRG LVL3 (GOWN DISPOSABLE) ×6 IMPLANT
PACK VAGINAL MINOR WOMEN LF (CUSTOM PROCEDURE TRAY) ×3 IMPLANT
PAD OB MATERNITY 4.3X12.25 (PERSONAL CARE ITEMS) ×3 IMPLANT
PAD PREP 24X48 CUFFED NSTRL (MISCELLANEOUS) ×3 IMPLANT
SET BERKELEY SUCTION TUBING (SUCTIONS) ×2 IMPLANT
SYR 20CC LL (SYRINGE) ×2 IMPLANT
TOP DISP BERKELEY (MISCELLANEOUS) ×2 IMPLANT
TOWEL OR 17X24 6PK STRL BLUE (TOWEL DISPOSABLE) ×3 IMPLANT
VACURETTE 10 RIGID CVD (CANNULA) ×2 IMPLANT
WATER STERILE IRR 1000ML POUR (IV SOLUTION) ×3 IMPLANT

## 2014-07-29 NOTE — Progress Notes (Signed)
Ana Hudson, [id number] S/P Dilation and Curettage  S: Patient reports some pain and cramping.  Patient expresses relief with procedure that has resolved bleeding, but feels that abdominal pain she was experiencing is GI related and not gynecological.   O:  Filed Vitals:   07/29/14 1445 07/29/14 1500 07/29/14 1603 07/29/14 1950  BP: 112/52 103/56 102/62 117/57  Pulse: 66 63 74 81  Temp: 98.8 F (37.1 C) 99.3 F (37.4 C) 98.6 F (37 C) 98.6 F (37 C)  TempSrc: Oral Oral Oral Oral  Resp: 22 16 16 18   Height:  5\' 8"  (1.727 m)    Weight:  252 lb (114.306 kg)    SpO2: 99% 99%  97%   Results for orders placed or performed during the hospital encounter of 07/29/14 (from the past 24 hour(s))  CBC     Status: Abnormal   Collection Time: 07/29/14  3:05 PM  Result Value Ref Range   WBC 10.0 4.0 - 10.5 K/uL   RBC 3.70 (L) 3.87 - 5.11 MIL/uL   Hemoglobin 10.6 (L) 12.0 - 15.0 g/dL   HCT 32.432.9 (L) 40.136.0 - 02.746.0 %   MCV 88.9 78.0 - 100.0 fL   MCH 28.6 26.0 - 34.0 pg   MCHC 32.2 30.0 - 36.0 g/dL   RDW 25.314.7 66.411.5 - 40.315.5 %   Platelets 271 150 - 400 K/uL    A: 3 weeks Postpartum S/P Dilation, Evacuation, and Curettage Uterine Tamponade    P: 20mL of fluid removed from catheter-2630mL remaining Discussed plan to remove catheter in am by Dr. Katharine LookJ. Ozan Questions and concerns addressed to patient satisfaction Okay to discontinue pitocin after bag infusion complete LR infusion at 125mL until catheter removed in morning Will continue to monitor  Sabas SousJ. Marilin Kofman, MSN, CNM 07/29/2014 8:18 PM

## 2014-07-29 NOTE — Op Note (Signed)
Preoperative diagnosis: post-partum retained POC  Postoperative diagnosis: Same  Anesthesia: IV sedation and paracervical block  Anesthesiologist: Dr. Gentry RochJudd  Procedure: Dilatation,evacuation and curettage  Surgeon: Dr. Dois DavenportSandra Ulani Degrasse  Estimated blood loss: 250 cc  Procedure:  After being informed of the planned procedure with possible complications including bleeding, infection and injury to uterus, informed consent is obtained and patient is taken to or #3. She is placed in the lithotomy position and given IV sedation without complication. She is then prepped and draped in a sterile  fashion and her bladder is emptied with an in and out red rubber catheter.  Pelvic exam reveals a anteverted  uterus compatible with [redacted] weeks gestation. Both adnexa are felt and normal.  A weighted speculum is inserted in the vagina and the anterior lip of the cervix was grasped with a tenaculum forcep. We proceed with a paracervical block using 20 cc of Nesacaine 1%. The uterus was then sounded at 12 cm. The cervix easily allowed Hegar dilator until  #35. We proceed with curettage of the uterine cavity retrieving a large amount of tissue including a 6 cm piece of placenta c/w ultrasound findings. Then uing a #10 curved cannula, we proceed with evacuation of products of conception without difficulty. Because of continued bleeding ultrasound was called in the room to confirm an empty cavity. Despite Cytotec 1000 mcg per rectum, Pitocin 40 units perfusion and 1 dose of Hemabate, bleeding is still significant. A Foley catheter was inserted in the uterine cavity and the balloon was inflated with 50 cc of saline which offered good compression of the whole uterine cavity per ultrasound evaluation.  Instruments are then removed. Instrument and sponge count is complete x2.   Estimated blood loss is 250 cc.   The procedure is very well tolerated by the patient is taken to recovery room in a well and stable condition. She  will be admitted overnight for observation.  Specimen: Products of conception sent to pathology

## 2014-07-29 NOTE — Anesthesia Postprocedure Evaluation (Signed)
  Anesthesia Post-op Note  Patient: Ana EdisonNicole R Inclan  Procedure(s) Performed: Procedure(s) (LRB): SUCTION DILATATION AND CURETTAGE (N/A)  Patient Location: PACU  Anesthesia Type: General  Level of Consciousness: awake and alert   Airway and Oxygen Therapy: Patient Spontanous Breathing  Post-op Pain: mild  Post-op Assessment: Post-op Vital signs reviewed, Patient's Cardiovascular Status Stable, Respiratory Function Stable, Patent Airway and No signs of Nausea or vomiting  Last Vitals:  Filed Vitals:   07/29/14 1104  BP: 116/76  Pulse:   Temp:   Resp:     Post-op Vital Signs: stable   Complications: No apparent anesthesia complications

## 2014-07-29 NOTE — Transfer of Care (Signed)
Immediate Anesthesia Transfer of Care Note  Patient: Ana Hudson  Procedure(s) Performed: Procedure(s): SUCTION DILATATION AND CURETTAGE (N/A)  Patient Location: PACU  Anesthesia Type:General  Level of Consciousness: awake, alert  and oriented  Airway & Oxygen Therapy: Patient Spontanous Breathing and Patient connected to nasal cannula oxygen  Post-op Assessment: Report given to RN, Post -op Vital signs reviewed and stable and Patient moving all extremities X 4  Post vital signs: Reviewed and stable  Last Vitals:  Filed Vitals:   07/29/14 1104  BP: 116/76  Pulse:   Temp:   Resp:     Complications: No apparent anesthesia complications

## 2014-07-29 NOTE — Anesthesia Preprocedure Evaluation (Addendum)
Anesthesia Evaluation  Patient identified by MRN, date of birth, ID band Patient awake    Reviewed: Allergy & Precautions, NPO status , Patient's Chart, lab work & pertinent test results  History of Anesthesia Complications Negative for: history of anesthetic complications  Airway Mallampati: III  TM Distance: >3 FB Neck ROM: Full    Dental  (+) Teeth Intact   Pulmonary neg shortness of breath, neg sleep apnea, neg COPDneg recent URI, former smoker,  breath sounds clear to auscultation        Cardiovascular negative cardio ROS  + Valvular Problems/Murmurs Rhythm:Regular     Neuro/Psych negative neurological ROS  negative psych ROS   GI/Hepatic negative GI ROS, Neg liver ROS,   Endo/Other  Morbid obesity  Renal/GU negative Renal ROS     Musculoskeletal   Abdominal   Peds  Hematology  (+) anemia ,   Anesthesia Other Findings   Reproductive/Obstetrics (+) Pregnancy                            Anesthesia Physical  Anesthesia Plan  ASA: III  Anesthesia Plan: General   Post-op Pain Management:    Induction: Intravenous  Airway Management Planned: LMA  Additional Equipment:   Intra-op Plan:   Post-operative Plan: Extubation in OR  Informed Consent: I have reviewed the patients History and Physical, chart, labs and discussed the procedure including the risks, benefits and alternatives for the proposed anesthesia with the patient or authorized representative who has indicated his/her understanding and acceptance.   Dental advisory given  Plan Discussed with: Anesthesiologist  Anesthesia Plan Comments:        Anesthesia Quick Evaluation

## 2014-07-29 NOTE — H&P (Signed)
   Reason for admission:   Retained POC    History:     Ana Hudson is a 24 y.o. female, G2P2011, presenting today to undergo D&E for post-partum retained POC symptomatic with heavy bleeding and pelvic pain. No fever. Patient had a SVD 07/08/14 complicated by shoulder dystocia and  PPH which required resuscitative measures with D&E and transfusion.  Patient was d/c home 07/10/14 with Hgb of 7.7 [facility] was 12.5   Review of system:  Non-contributory   Past Medical History:   Past Medical History  Diagnosis Date  . Seizures   . Anemia   . Heart murmur   . Postpartum hemorrhage     Allergies:   Allergies  Allergen Reactions  . Nickel Rash    Social History:   History   Social History  . Marital Status: Single    Spouse Name: N/A  . Number of Children: N/A  . Years of Education: N/A   Social History Main Topics  . Smoking status: Former Games developermoker  . Smokeless tobacco: Never Used  . Alcohol Use: Yes     Comment: rare when not pregnant  . Drug Use: Yes    Special: Marijuana  . Sexual Activity: Yes    Birth Control/ Protection: None   Other Topics Concern  . Not on file   Social History Narrative    Family History:    Father with HTN Sister asthma  Physical exam:    General Appearance: Alert, appropriate appearance for age. No acute distress Neck / Thyroid: Supple, no masses, nodes or enlargement Lungs: clear to auscultation bilaterally Back: No CVA tenderness. Cardiovascular: Regular rate and rhythm. S1, S2, no murmur Gastrointestinal: Soft, non-tender, no masses or organomegaly Pelvic Exam: deferred ( [facility] with mild to moderate bleeding)  Transvaginal ultrasound 07/28/14: 5.6 cm area c/w retained placenta     Assessment:   Retained POC   Plan:    Will proceed with D&E: procedure, R&B reviewed with patient and questions answered.

## 2014-07-30 LAB — CBC
HCT: 27.5 % — ABNORMAL LOW (ref 36.0–46.0)
HCT: 27.6 % — ABNORMAL LOW (ref 36.0–46.0)
HEMOGLOBIN: 9 g/dL — AB (ref 12.0–15.0)
Hemoglobin: 9 g/dL — ABNORMAL LOW (ref 12.0–15.0)
MCH: 28.9 pg (ref 26.0–34.0)
MCH: 29 pg (ref 26.0–34.0)
MCHC: 32.7 g/dL (ref 30.0–36.0)
MCHC: 32.6 g/dL (ref 30.0–36.0)
MCV: 88.4 fL (ref 78.0–100.0)
MCV: 89 fL (ref 78.0–100.0)
Platelets: 268 10*3/uL (ref 150–400)
Platelets: 246 10*3/uL (ref 150–400)
RBC: 3.11 MIL/uL — AB (ref 3.87–5.11)
RBC: 3.1 MIL/uL — ABNORMAL LOW (ref 3.87–5.11)
RDW: 14.6 % (ref 11.5–15.5)
RDW: 14.8 % (ref 11.5–15.5)
WBC: 12.8 10*3/uL — AB (ref 4.0–10.5)
WBC: 8.4 10*3/uL (ref 4.0–10.5)

## 2014-07-30 MED ORDER — LACTATED RINGERS IV SOLN
INTRAVENOUS | Status: DC
  Administered 2014-07-30: 16:00:00 via INTRAVENOUS

## 2014-07-30 NOTE — Plan of Care (Signed)
Problem: Phase I Progression Outcomes Goal: Pain controlled with appropriate interventions Outcome: Completed/Met Date Met:  07/30/14 Good pain control on po Motrin and Percocet. Goal: Other Phase I Outcomes/Goals Outcome: Completed/Met Date Met:  07/30/14 Voiding qs amt of urine without any problems.  Problem: Phase II Progression Outcomes Goal: Afebrile, VS remain stable Outcome: Completed/Met Date Met:  07/30/14 VSS stable at this time.     Problem: Discharge Progression Outcomes Goal: Barriers To Progression Addressed/Resolved Outcome: Completed/Met Date Met:  07/30/14 Tolerates walking well.

## 2014-07-30 NOTE — Anesthesia Postprocedure Evaluation (Signed)
  Anesthesia Post-op Note  Patient: Ana Hudson  Procedure(s) Performed: Procedure(s): SUCTION DILATATION AND CURETTAGE (N/A)  Patient Location: Women's Unit  Anesthesia Type:gen  Level of Consciousness: awake  Airway and Oxygen Therapy: Patient Spontanous Breathing  Post-op Pain: mild  Post-op Assessment: Patient's Cardiovascular Status Stable and Respiratory Function Stable  Post-op Vital Signs: stable  Last Vitals:  Filed Vitals:   07/30/14 1404  BP: 96/46  Pulse: 65  Temp: 37.1 C  Resp: 18    Complications: No apparent anesthesia complications

## 2014-07-30 NOTE — Addendum Note (Signed)
Addendum  created 07/30/14 1520 by Renford DillsJanet L Aleksei Goodlin, CRNA   Modules edited: Notes Section   Notes Section:  File: [id number]

## 2014-07-30 NOTE — Progress Notes (Signed)
Addendum Pt c/o feeling weak and dizzy.  BP at 1400 96/46 P 65.Marland Kitchen.   Bleeding scant.   abd soft to touch Urine OP wnl   Plan Orthostatic BP at 1600 CBC  Now increase IVF to 150cc/hr

## 2014-07-30 NOTE — Progress Notes (Signed)
Ana EdisonNicole R Billing, [id number] S/P Dilation and Curettage  S: Patient resting comfortably in bed.  Reports some cramping, but minimal.  She does report slight headache this am.  No headache or dizziness.  No SOB/CP/F/C.  Tolerating general diet.  Pt voiding without difficulty. O: BP 99/56 mmHg  Pulse 59  Temp(Src) 98 F (36.7 C) (Oral)  Resp 17  Ht 5\' 8"  (1.727 m)  Wt 114.306 kg (252 lb)  BMI 38.33 kg/m2  SpO2 97%  Gen: NAD CV: RRR Lungs: CTAB Abd: obese, soft, non-tender, uterus feels firm though difficult to palpate due to obesity GU: Intrauterine foley in place ~ 10cc noted in bag, 30cc of fluid along with foley removed this am Ext: no calf tenderness bilaterally  Results for orders placed or performed during the hospital encounter of 07/29/14 (from the past 24 hour(s))  CBC     Status: Abnormal   Collection Time: 07/29/14  3:05 PM  Result Value Ref Range   WBC 10.0 4.0 - 10.5 K/uL   RBC 3.70 (L) 3.87 - 5.11 MIL/uL   Hemoglobin 10.6 (L) 12.0 - 15.0 g/dL   HCT 21.332.9 (L) 08.636.0 - 57.846.0 %   MCV 88.9 78.0 - 100.0 fL   MCH 28.6 26.0 - 34.0 pg   MCHC 32.2 30.0 - 36.0 g/dL   RDW 46.914.7 62.911.5 - 52.815.5 %   Platelets 271 150 - 400 K/uL  CBC     Status: Abnormal   Collection Time: 07/30/14  5:05 AM  Result Value Ref Range   WBC 12.8 (H) 4.0 - 10.5 K/uL   RBC 3.11 (L) 3.87 - 5.11 MIL/uL   Hemoglobin 9.0 (L) 12.0 - 15.0 g/dL   HCT 41.327.5 (L) 24.436.0 - 01.046.0 %   MCV 88.4 78.0 - 100.0 fL   MCH 28.9 26.0 - 34.0 pg   MCHC 32.7 30.0 - 36.0 g/dL   RDW 27.214.6 53.611.5 - 64.415.5 %   Platelets 268 150 - 400 K/uL    A: 3 weeks Postpartum S/P Dilation, Evacuation, and Curettage with Uterine Tamponade   P:  -100cc of blood noted overnight, foley removed this am, will continue to monitor bleeding thsi am -CBC stable as above, VSS, currently asymptomatic, will continue to monitor -continue IVF: LR @ 125cc/hr -Will continue to monitor bleeding today, if patient remains asymptomatic with minimal bleeding plan to  discharge home with close outpatient follow up  Myna HidalgoJennifer Bernita Beckstrom, DO [phone] (pager) [phone] (office)

## 2014-07-30 NOTE — Progress Notes (Signed)
Utilization Review completed.  

## 2014-07-31 NOTE — Progress Notes (Signed)
Discharge teaching complete. Pt understood all instructions and did not have any questions. Pt pushed via wheelchair and discharged home to family. 

## 2014-07-31 NOTE — Discharge Summary (Signed)
Vaginal Delivery Discharge Summary  ALL information will be verified prior to discharge  Ana Hudson  DOB:    [birth year] MRN:    [id number] CSN:    [id number]  Date of admission:                  07/29/14  Date of discharge:                   07/31/14  Procedures this admission: D&E for post-partum retained POC symptomatic with heavy bleeding and pelvic pain.    Date of Delivery: 07/08/14   History of Present Illness: Ana Hudson is a 24 y.o. female, G2P1011, who presents at Unknown weeks gestation. The patient [facility]. She was admitted D&E for post-partum retained POC symptomatic with heavy bleeding and pelvic pain. . Her pregnancy has been complicated by:  Patient Active Problem List   Diagnosis Date Noted  . Vaginal delivery 07/08/2014  . Postpartum hemorrhage 94/22/2016  . Shoulder dystocia 07/08/2014  . Chorioamnionitis 07/08/2014  . Severe obesity (BMI >= 40) 07/07/2014  . IBS (irritable bowel syndrome) - constipation and diarrhea 07/07/2014  . 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 Simple febrile seizure - at age 26 months, then again at 24 years of age 94/21/2016  . Clomid pregnancy 07/07/2014    Hospital course: The patient was admitted for D&E.   Her labor was not complicated. She proceeded to have a vaginal delivery of a healthy infant. Her delivery was complicated by shoulder dystocia and PPH which required resuscitative measures with D&E and transfusion.  Her postpartum course was not complicated. She was discharged to home on postpartum day 21 doing well.  Feeding: breast  Contraception: Nexplanon Pt understands the risks birth control are not limited to irregular bleeding, formation of DVT, fluid fluctuations, elevation in blood pressure, stroke, breast tenderness and liver damage.  She states she will report any serious side  effects.  She has been given verbal and written instructions and voiced a clear understanding.    Discharge hemoglobin: HEMOGLOBIN  Date Value Ref Range Status  07/30/2014 9.0* 12.0 - 15.0 g/dL Final   HCT  Date Value Ref Range Status  07/30/2014 27.6* 36.0 - 46.0 % Final    PreNatal Labs ABO, Rh: --/--/B POS, B POS (01/21 0240)   Antibody: NEG (01/21 0240) Rubella:   immune RPR: Non Reactive (01/21 0240)  HBsAg: Negative (06/12 0000)  HIV: Non-reactive (06/12 0000)  GBS: Negative (12/21 0000)  Discharge Physical Exam:  General: alert, cooperative and morbidly obese Vaginal bleeding is appropriate DVT Evaluation: No evidence of DVT seen on physical exam.   Postpartum Procedures: D&E for post-partum retained POC Complications-Operative and Postpartum: none  Discharge Diagnoses: D&E for POC  Activity:           pelvic rest Diet:                routine Medications: resume all prescribed Condition:      stable     Discharge to: home  Pt to keep her postpartum appointment on August 19, 2014   Follow-up Information    [facility].   Specialty:  Obstetrics and Gynecology   Why:  Postpartum check up, Call with any questions or concerns, If symptoms worsen, As needed   Contact information:   [address]  [address] [place] [address] [phone]       Salome Cozby, CNM, MSN 07/31/2014. 10:35 AM   Postpartum Care After Vaginal Delivery  After you deliver your newborn (postpartum period), the usual stay in the hospital is 24 72 hours. If there were problems with your labor or delivery, or if you have other medical problems, you might be in the hospital longer.  While you are in the hospital, you will receive help and instructions on how to care for yourself and your newborn during the postpartum period.  While you are in the hospital:  Be sure to tell your nurses if you have pain or discomfort,  as well as where you feel the pain and what makes the pain worse.  If you had an incision made near your vagina (episiotomy) or if you had some tearing during delivery, the nurses may put ice packs on your episiotomy or tear. The ice packs may help to reduce the pain and swelling.  If you are breastfeeding, you may feel uncomfortable contractions of your uterus for a couple of weeks. This is normal. The contractions help your uterus get back to normal size.  It is normal to have some bleeding after delivery.  For the first 1 3 days after delivery, the flow is red and the amount may be similar to a period.  It is common for the flow to start and stop.  In the first few days, you may pass some small clots. Let your nurses know if you begin to pass large clots or your flow increases.  Do not  flush blood clots down the toilet before having the nurse look at them.  During the next 3 10 days after delivery, your flow should become more watery and pink or brown-tinged in color.  Ten to fourteen days after delivery, your flow should be a small amount of yellowish-white discharge.  The amount of your flow will decrease over the first few weeks after delivery. Your flow may stop in 6 8 weeks. Most women have had their flow stop by 12 weeks after delivery.  You should change your sanitary pads frequently.  Wash your hands thoroughly with soap and water for at least 20 seconds after changing pads, using the toilet, or before holding or feeding your newborn.  You should feel like you need to empty your bladder within the first 6 8 hours after delivery.  In case you become weak, lightheaded, or faint, call your nurse before you get out of bed for the first time and before you take a shower for the first time.  Within the first few days after delivery, your breasts may begin to feel tender and full. This is called engorgement. Breast tenderness usually goes away within 48 72 hours after engorgement  occurs. You may also notice milk leaking from your breasts. If you are not breastfeeding, do not stimulate your breasts. Breast stimulation can make your breasts produce more milk.  Spending as much time as possible with your newborn is very important. During this time, you and your newborn can feel close and get to know each other. Having your newborn stay in your room (rooming in) will help to strengthen the bond with your newborn. It will give you time to get to know your newborn and become comfortable caring for your newborn.  Your hormones change after delivery. Sometimes the hormone changes can temporarily cause you to feel sad or tearful. These feelings should not last more than a  few days. If these feelings last longer than that, you should talk to your caregiver.  If desired, talk to your caregiver about methods of family planning or contraception.  Talk to your caregiver about immunizations. Your caregiver may want you to have the following immunizations before leaving the hospital:  Tetanus, diphtheria, and pertussis (Tdap) or tetanus and diphtheria (Td) immunization. It is very important that you and your family (including grandparents) or others caring for your newborn are up-to-date with the Tdap or Td immunizations. The Tdap or Td immunization can help protect your newborn from getting ill.  Rubella immunization.  Varicella (chickenpox) immunization.  Influenza immunization. You should receive this annual immunization if you did not receive the immunization during your pregnancy. Document Released: 03/31/2007 Document Revised: 02/26/2012 Document Reviewed: 01/29/2012 [facility] Patient Information 2014 [place], [place].   Postpartum Depression and Baby Blues  The postpartum period begins right after the birth of a baby. During this time, there is often a great amount of joy and excitement. It is also a time of considerable changes in the life of the parent(s). Regardless of how  many times a mother gives birth, each child brings new challenges and dynamics to the family. It is not unusual to have feelings of excitement accompanied by confusing shifts in moods, emotions, and thoughts. All mothers are at risk of developing postpartum depression or the "baby blues." These mood changes can occur right after giving birth, or they may occur many months after giving birth. The baby blues or postpartum depression can be mild or severe. Additionally, postpartum depression can resolve rather quickly, or it can be a long-term condition. CAUSES Elevated hormones and their rapid decline are thought to be a main cause of postpartum depression and the baby blues. There are a number of hormones that radically change during and after pregnancy. Estrogen and progesterone usually decrease immediately after delivering your baby. The level of thyroid hormone and various cortisol steroids also rapidly drop. Other factors that play a major role in these changes include major life events and genetics.  RISK FACTORS If you have any of the following risks for the baby blues or postpartum depression, know what symptoms to watch out for during the postpartum period. Risk factors that may increase the likelihood of getting the baby blues or postpartum depression include:  Havinga personal or family history of depression.  Having depression while being pregnant.  Having premenstrual or oral contraceptive-associated mood issues.  Having exceptional life stress.  Having marital conflict.  Lacking a social support network.  Having a baby with special needs.  Having health problems such as diabetes. SYMPTOMS Baby blues symptoms include:  Brief fluctuations in mood, such as going from extreme happiness to sadness.  Decreased concentration.  Difficulty sleeping.  Crying spells, tearfulness.  Irritability.  Anxiety. Postpartum depression symptoms typically begin within the first month after  giving birth. These symptoms include:  Difficulty sleeping or excessive sleepiness.  Marked weight loss.  Agitation.  Feelings of worthlessness.  Lack of interest in activity or food. Postpartum psychosis is a very concerning condition and can be dangerous. Fortunately, it is rare. Displaying any of the following symptoms is cause for immediate medical attention. Postpartum psychosis symptoms include:  Hallucinations and delusions.  Bizarre or disorganized behavior.  Confusion or disorientation. DIAGNOSIS  A diagnosis is made by an evaluation of your symptoms. There are no medical or lab tests that lead to a diagnosis, but there are various questionnaires that a caregiver may use to  identify those with the baby blues, postpartum depression, or psychosis. Often times, a screening tool called the [place] Postnatal Depression Scale is used to diagnose depression in the postpartum period.  TREATMENT The baby blues usually goes away on its own in 1 to 2 weeks. Social support is often all that is needed. You should be encouraged to get adequate sleep and rest. Occasionally, you may be given medicines to help you sleep.  Postpartum depression requires treatment as it can last several months or longer if it is not treated. Treatment may include individual or group therapy, medicine, or both to address any social, physiological, and psychological factors that may play a role in the depression. Regular exercise, a healthy diet, rest, and social support may also be strongly recommended.  Postpartum psychosis is more serious and needs treatment right away. Hospitalization is often needed. HOME CARE INSTRUCTIONS  Get as much rest as you can. Nap when the baby sleeps.  Exercise regularly. Some women find yoga and walking to be beneficial.  Eat a balanced and nourishing diet.  Do little things that you enjoy. Have a cup of tea, take a bubble bath, read your favorite magazine, or listen to your  favorite music.  Avoid alcohol.  Ask for help with household chores, cooking, grocery shopping, or running errands as needed. Do not try to do everything.  Talk to people close to you about how you are feeling. Get support from your partner, family members, friends, or other new moms.  Try to stay positive in how you think. Think about the things you are grateful for.  Do not spend a lot of time alone.  Only take medicine as directed by your caregiver.  Keep all your postpartum appointments.  Let your caregiver know if you have any concerns. SEEK MEDICAL CARE IF: You are having a reaction or problems with your medicine. SEEK IMMEDIATE MEDICAL CARE IF:  You have suicidal feelings.  You feel you may harm the baby or someone else. Document Released: 03/07/2004 Document Revised: 08/26/2011 Document Reviewed: 04/09/2011 [facility] Patient Information 2014 [place], [place].     Breastfeeding Deciding to breastfeed is one of the best choices you can make for you and your baby. A change in hormones during pregnancy causes your breast tissue to grow and increases the number and size of your milk ducts. These hormones also allow proteins, sugars, and fats from your blood supply to make breast milk in your milk-producing glands. Hormones prevent breast milk from being released before your baby is born as well as prompt milk flow after birth. Once breastfeeding has begun, thoughts of your baby, as well as his or her sucking or crying, can stimulate the release of milk from your milk-producing glands.  BENEFITS OF BREASTFEEDING For Your Baby  Your first milk (colostrum) helps your baby's digestive system function better.   There are antibodies in your milk that help your baby fight off infections.   Your baby has a lower incidence of asthma, allergies, and sudden infant death syndrome.   The nutrients in breast milk are better for your baby than infant formulas and are designed uniquely  for your baby's needs.   Breast milk improves your baby's brain development.   Your baby is less likely to develop other conditions, such as childhood obesity, asthma, or type 2 diabetes mellitus.  For You   Breastfeeding helps to create a very special bond between you and your baby.   Breastfeeding is convenient. Breast milk is always  available at the correct temperature and costs nothing.   Breastfeeding helps to burn calories and helps you lose the weight gained during pregnancy.   Breastfeeding makes your uterus contract to its prepregnancy size faster and slows bleeding (lochia) after you give birth.   Breastfeeding helps to lower your risk of developing type 2 diabetes mellitus, osteoporosis, and breast or ovarian cancer later in life. SIGNS THAT YOUR BABY IS HUNGRY Early Signs of Hunger  Increased alertness or activity.  Stretching.  Movement of the head from side to side.  Movement of the head and opening of the mouth when the corner of the mouth or cheek is stroked (rooting).  Increased sucking sounds, smacking lips, cooing, sighing, or squeaking.  Hand-to-mouth movements.  Increased sucking of fingers or hands. Late Signs of Hunger  Fussing.  Intermittent crying. Extreme Signs of Hunger Signs of extreme hunger will require calming and consoling before your baby will be able to breastfeed successfully. Do not wait for the following signs of extreme hunger to occur before you initiate breastfeeding:   Restlessness.  A loud, strong cry.   Screaming.   BREASTFEEDING BASICS Breastfeeding Initiation  Find a comfortable place to sit or lie down, with your neck and back well supported.  Place a pillow or rolled up blanket under your baby to bring him or her to the level of your breast (if you are seated). Nursing pillows are specially designed to help support your arms and your baby while you breastfeed.  Make sure that your baby's abdomen is facing  your abdomen.   Gently massage your breast. With your fingertips, massage from your chest wall toward your nipple in a circular motion. This encourages milk flow. You may need to continue this action during the feeding if your milk flows slowly.  Support your breast with 4 fingers underneath and your thumb above your nipple. Make sure your fingers are well away from your nipple and your baby's mouth.   Stroke your baby's lips gently with your finger or nipple.   When your baby's mouth is open wide enough, quickly bring your baby to your breast, placing your entire nipple and as much of the colored area around your nipple (areola) as possible into your baby's mouth.   More areola should be visible above your baby's upper lip than below the lower lip.   Your baby's tongue should be between his or her lower gum and your breast.   Ensure that your baby's mouth is correctly positioned around your nipple (latched). Your baby's lips should create a seal on your breast and be turned out (everted).  It is common for your baby to suck about 2-3 minutes in order to start the flow of breast milk. Latching Teaching your baby how to latch on to your breast properly is very important. An improper latch can cause nipple pain and decreased milk supply for you and poor weight gain in your baby. Also, if your baby is not latched onto your nipple properly, he or she may swallow some air during feeding. This can make your baby fussy. Burping your baby when you switch breasts during the feeding can help to get rid of the air. However, teaching your baby to latch on properly is still the best way to prevent fussiness from swallowing air while breastfeeding. Signs that your baby has successfully latched on to your nipple:    Silent tugging or silent sucking, without causing you pain.   Swallowing heard between every 3-4  sucks.    Muscle movement above and in front of his or her ears while sucking.  Signs  that your baby has not successfully latched on to nipple:   Sucking sounds or smacking sounds from your baby while breastfeeding.  Nipple pain. If you think your baby has not latched on correctly, slip your finger into the corner of your baby's mouth to break the suction and place it between your baby's gums. Attempt breastfeeding initiation again. Signs of Successful Breastfeeding Signs from your baby:   A gradual decrease in the number of sucks or complete cessation of sucking.   Falling asleep.   Relaxation of his or her body.   Retention of a small amount of milk in his or her mouth.   Letting go of your breast by himself or herself. Signs from you:  Breasts that have increased in firmness, weight, and size 1-3 hours after feeding.   Breasts that are softer immediately after breastfeeding.  Increased milk volume, as well as a change in milk consistency and color by the fifth day of breastfeeding.   Nipples that are not sore, cracked, or bleeding. Signs That Your Pecola LeisureBaby is Getting Enough Milk  Wetting at least 3 diapers in a 24-hour period. The urine should be clear and pale yellow by age 65 days.  At least 3 stools in a 24-hour period by age 65 days. The stool should be soft and yellow.  At least 3 stools in a 24-hour period by age 28 days. The stool should be seedy and yellow.  No loss of weight greater than 10% of birth weight during the first 203 days of age.  Average weight gain of 4-7 ounces (113-198 g) per week after age 76 days.  Consistent daily weight gain by age 65 days, without weight loss after the age of 2 weeks. After a feeding, your baby may spit up a small amount. This is common. BREASTFEEDING FREQUENCY AND DURATION Frequent feeding will help you make more milk and can prevent sore nipples and breast engorgement. Breastfeed when you feel the need to reduce the fullness of your breasts or when your baby shows signs of hunger. This is called "breastfeeding on  demand." Avoid introducing a pacifier to your baby while you are working to establish breastfeeding (the first 4-6 weeks after your baby is born). After this time you may choose to use a pacifier. Research has shown that pacifier use during the first year of a baby's life decreases the risk of sudden infant death syndrome (SIDS). Allow your baby to feed on each breast as long as he or she wants. Breastfeed until your baby is finished feeding. When your baby unlatches or falls asleep while feeding from the first breast, offer the second breast. Because newborns are often sleepy in the first few weeks of life, you may need to awaken your baby to get him or her to feed. Breastfeeding times will vary from baby to baby. However, the following rules can serve as a guide to help you ensure that your baby is properly fed:  Newborns (babies 424 weeks of age or younger) may breastfeed every 1-3 hours.  Newborns should not go longer than 3 hours during the day or 5 hours during the night without breastfeeding.  You should breastfeed your baby a minimum of 8 times in a 24-hour period until you begin to introduce solid foods to your baby at around 646 months of age. BREAST MILK PUMPING Pumping and storing breast milk  allows you to ensure that your baby is exclusively fed your breast milk, even at times when you are unable to breastfeed. This is especially important if you are going back to work while you are still breastfeeding or when you are not able to be present during feedings. Your lactation consultant can give you guidelines on how long it is safe to store breast milk.  A breast pump is a machine that allows you to pump milk from your breast into a sterile bottle. The pumped breast milk can then be stored in a refrigerator or freezer. Some breast pumps are operated by hand, while others use electricity. Ask your lactation consultant which type will work best for you. Breast pumps can be purchased, but some  hospitals and breastfeeding support groups lease breast pumps on a monthly basis. A lactation consultant can teach you how to hand express breast milk, if you prefer not to use a pump.  CARING FOR YOUR BREASTS WHILE YOU BREASTFEED Nipples can become dry, cracked, and sore while breastfeeding. The following recommendations can help keep your breasts moisturized and healthy:  Avoid using soap on your nipples.   Wear a supportive bra. Although not required, special nursing bras and tank tops are designed to allow access to your breasts for breastfeeding without taking off your entire bra or top. Avoid wearing underwire-style bras or extremely tight bras.  Air dry your nipples for 3-364minutes after each feeding.   Use only cotton bra pads to absorb leaked breast milk. Leaking of breast milk between feedings is normal.   Use lanolin on your nipples after breastfeeding. Lanolin helps to maintain your skin's normal moisture barrier. If you use pure lanolin, you do not need to wash it off before feeding your baby again. Pure lanolin is not toxic to your baby. You may also hand express a few drops of breast milk and gently massage that milk into your nipples and allow the milk to air dry. In the first few weeks after giving birth, some women experience extremely full breasts (engorgement). Engorgement can make your breasts feel heavy, warm, and tender to the touch. Engorgement peaks within 3-5 days after you give birth. The following recommendations can help ease engorgement:  Completely empty your breasts while breastfeeding or pumping. You may want to start by applying warm, moist heat (in the shower or with warm water-soaked hand towels) just before feeding or pumping. This increases circulation and helps the milk flow. If your baby does not completely empty your breasts while breastfeeding, pump any extra milk after he or she is finished.  Wear a snug bra (nursing or regular) or tank top for 1-2 days  to signal your body to slightly decrease milk production.  Apply ice packs to your breasts, unless this is too uncomfortable for you.  Make sure that your baby is latched on and positioned properly while breastfeeding. If engorgement persists after 48 hours of following these recommendations, contact your health care provider or a Advertising copywriterlactation consultant. OVERALL HEALTH CARE RECOMMENDATIONS WHILE BREASTFEEDING  Eat healthy foods. Alternate between meals and snacks, eating 3 of each per day. Because what you eat affects your breast milk, some of the foods may make your baby more irritable than usual. Avoid eating these foods if you are sure that they are negatively affecting your baby.  Drink milk, fruit juice, and water to satisfy your thirst (about 10 glasses a day).   Rest often, relax, and continue to take your prenatal vitamins to prevent  fatigue, stress, and anemia.  Continue breast self-awareness checks.  Avoid chewing and smoking tobacco.  Avoid alcohol and drug use. Some medicines that may be harmful to your baby can pass through breast milk. It is important to ask your health care provider before taking any medicine, including all over-the-counter and prescription medicine as well as vitamin and herbal supplements. It is possible to become pregnant while breastfeeding. If birth control is desired, ask your health care provider about options that will be safe for your baby. SEEK MEDICAL CARE IF:   You feel like you want to stop breastfeeding or have become frustrated with breastfeeding.  You have painful breasts or nipples.  Your nipples are cracked or bleeding.  Your breasts are red, tender, or warm.  You have a swollen area on either breast.  You have a fever or chills.  You have nausea or vomiting.  You have drainage other than breast milk from your nipples.  Your breasts do not become full before feedings by the fifth day after you give birth.  You feel sad and  depressed.  Your baby is too sleepy to eat well.  Your baby is having trouble sleeping.   Your baby is wetting less than 3 diapers in a 24-hour period.  Your baby has less than 3 stools in a 24-hour period.  Your baby's skin or the white part of his or her eyes becomes yellow.   Your baby is not gaining weight by 32 days of age. SEEK IMMEDIATE MEDICAL CARE IF:   Your baby is overly tired (lethargic) and does not want to wake up and feed.  Your baby develops an unexplained fever. Document Released: 06/03/2005 Document Revised: 06/08/2013 Document Reviewed: 11/25/2012 [facility] Patient Information 2015 [place], [place]. This information is not intended to replace advice given to you by your health care provider. Make sure you discuss any questions you have with your health care provider.

## 2014-08-01 ENCOUNTER — Encounter (HOSPITAL_COMMUNITY): Payer: Self-pay | Admitting: Obstetrics and Gynecology

## 2014-08-07 ENCOUNTER — Inpatient Hospital Stay (HOSPITAL_COMMUNITY): Payer: Medicaid Other

## 2014-08-07 ENCOUNTER — Inpatient Hospital Stay (HOSPITAL_COMMUNITY)
Admission: AD | Admit: 2014-08-07 | Discharge: 2014-08-07 | Disposition: A | Discharge status: Home / Self Care | Payer: Medicaid Other | Source: Ambulatory Visit | Attending: Obstetrics and Gynecology | Admitting: Obstetrics and Gynecology

## 2014-08-07 ENCOUNTER — Encounter (HOSPITAL_COMMUNITY): Payer: Self-pay | Admitting: *Deleted

## 2014-08-07 DIAGNOSIS — Z87891 Personal history of nicotine dependence: Secondary | ICD-10-CM | POA: Insufficient documentation

## 2014-08-07 DIAGNOSIS — R109 Unspecified abdominal pain: Secondary | ICD-10-CM | POA: Insufficient documentation

## 2014-08-07 DIAGNOSIS — R1010 Upper abdominal pain, unspecified: Secondary | ICD-10-CM

## 2014-08-07 DIAGNOSIS — O9089 Other complications of the puerperium, not elsewhere classified: Secondary | ICD-10-CM | POA: Diagnosis not present

## 2014-08-07 DIAGNOSIS — K589 Irritable bowel syndrome without diarrhea: Secondary | ICD-10-CM | POA: Insufficient documentation

## 2014-08-07 DIAGNOSIS — R161 Splenomegaly, not elsewhere classified: Secondary | ICD-10-CM | POA: Insufficient documentation

## 2014-08-07 LAB — CBC WITH DIFFERENTIAL/PLATELET
Basophils Absolute: 0.1 10*3/uL (ref 0.0–0.1)
Basophils Relative: 1 % (ref 0–1)
Eosinophils Absolute: 0.3 10*3/uL (ref 0.0–0.7)
Eosinophils Relative: 4 % (ref 0–5)
HCT: 29.1 % — ABNORMAL LOW (ref 36.0–46.0)
HEMOGLOBIN: 9.6 g/dL — AB (ref 12.0–15.0)
Lymphocytes Relative: 31 % (ref 12–46)
Lymphs Abs: 1.9 10*3/uL (ref 0.7–4.0)
MCH: 29.5 pg (ref 26.0–34.0)
MCHC: 33 g/dL (ref 30.0–36.0)
MCV: 89.5 fL (ref 78.0–100.0)
MONO ABS: 0.4 10*3/uL (ref 0.1–1.0)
Monocytes Relative: 7 % (ref 3–12)
NEUTROS PCT: 57 % (ref 43–77)
Neutro Abs: 3.6 10*3/uL (ref 1.7–7.7)
PLATELETS: 242 10*3/uL (ref 150–400)
RBC: 3.25 MIL/uL — ABNORMAL LOW (ref 3.87–5.11)
RDW: 15.6 % — ABNORMAL HIGH (ref 11.5–15.5)
WBC: 6.3 10*3/uL (ref 4.0–10.5)

## 2014-08-07 LAB — COMPREHENSIVE METABOLIC PANEL
ALBUMIN: 4 g/dL (ref 3.5–5.2)
ALK PHOS: 62 U/L (ref 39–117)
ALT: 35 U/L (ref 0–35)
ANION GAP: 2 — AB (ref 5–15)
AST: 26 U/L (ref 0–37)
BILIRUBIN TOTAL: 1 mg/dL (ref 0.3–1.2)
BUN: 11 mg/dL (ref 6–23)
CO2: 25 mmol/L (ref 19–32)
Calcium: 8.7 mg/dL (ref 8.4–10.5)
Chloride: 112 mmol/L (ref 96–112)
Creatinine, Ser: 0.72 mg/dL (ref 0.50–1.10)
GFR calc Af Amer: >90 mL/min (ref >90)
GLUCOSE: 96 mg/dL (ref 70–99)
Potassium: 4.3 mmol/L (ref 3.5–5.1)
Sodium: 139 mmol/L (ref 135–145)
Total Protein: 6.5 g/dL (ref 6.0–8.3)

## 2014-08-07 LAB — URINALYSIS, ROUTINE W REFLEX MICROSCOPIC
Bilirubin Urine: NEGATIVE
Glucose, UA: NEGATIVE mg/dL
Ketones, ur: NEGATIVE mg/dL
Nitrite: NEGATIVE
Protein, ur: NEGATIVE mg/dL
Specific Gravity, Urine: 1.025 (ref 1.005–1.030)
UROBILINOGEN UA: 0.2 mg/dL (ref 0.0–1.0)
pH: 5.5 (ref 5.0–8.0)

## 2014-08-07 LAB — URINE MICROSCOPIC-ADD ON

## 2014-08-07 IMAGING — US US ABDOMEN COMPLETE
1 series · 14 of 25 positions shown · non-contrast
Comparison: None.

ADDENDUM:
Calculated swelling volume is 594 mL.

Upper normal splenic volume utilizing this measurement technique is
411 mL.
CLINICAL DATA: Abdominal pain
EXAM:
ULTRASOUND ABDOMEN COMPLETE

[Series 1: us abdomen complete · 86 acquisitions, 14 frames shown]
[im 1/86]
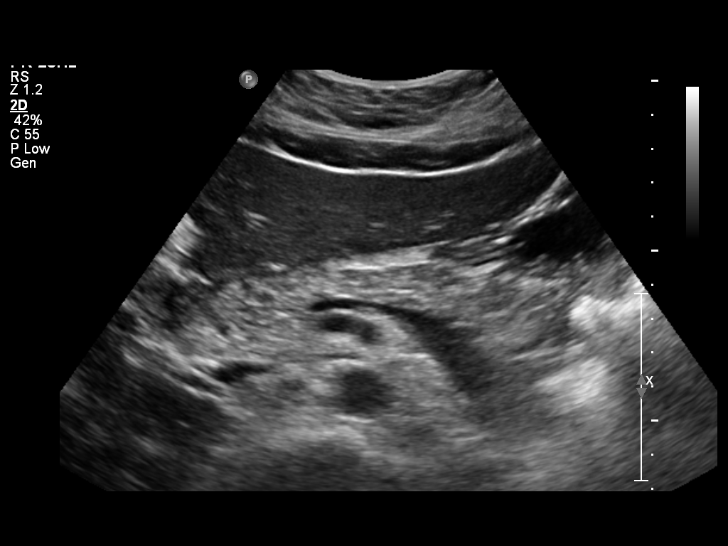
[im 8/86]
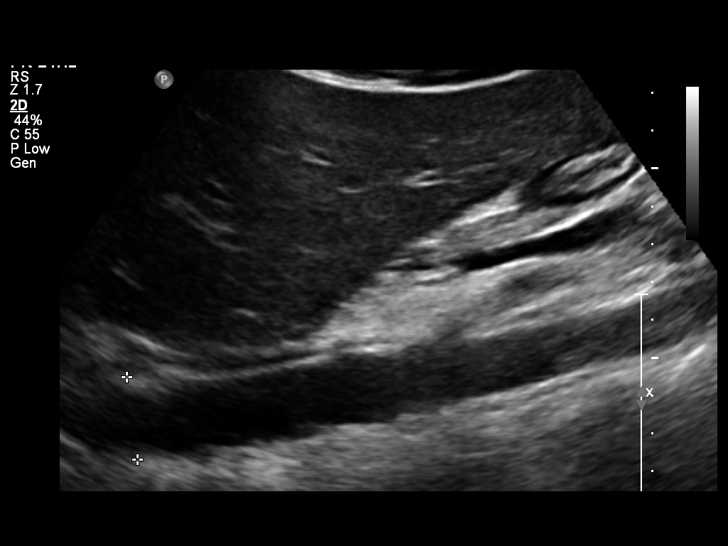
[im 15/86]
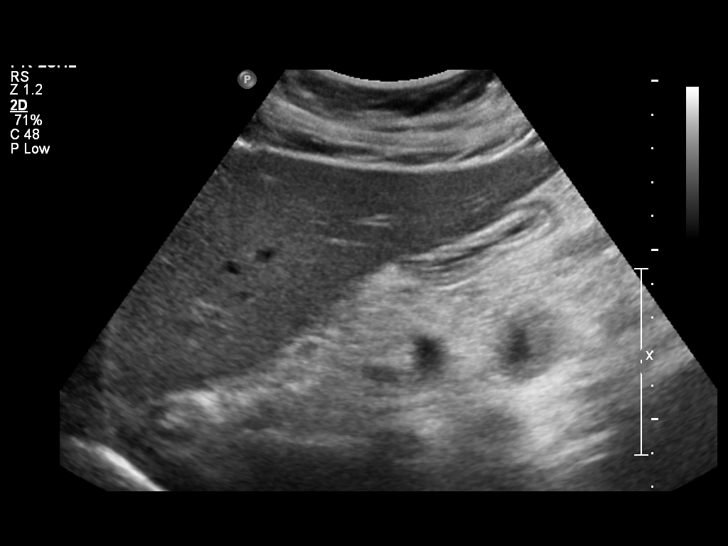
[im 22/86]
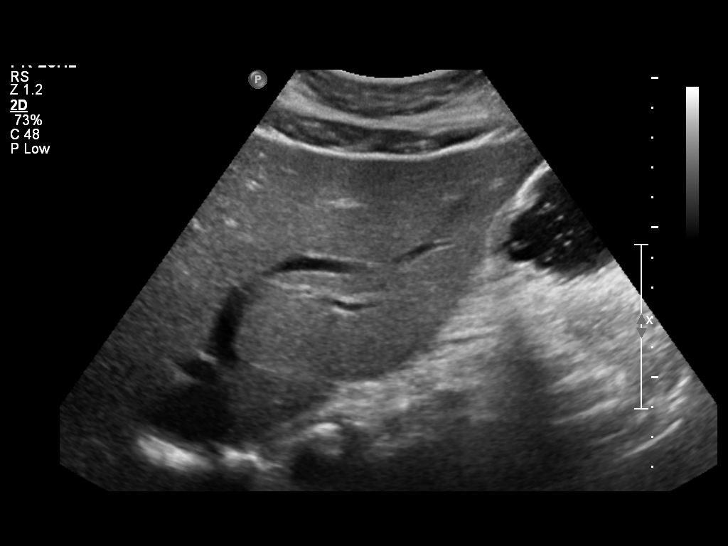
[im 29/86]
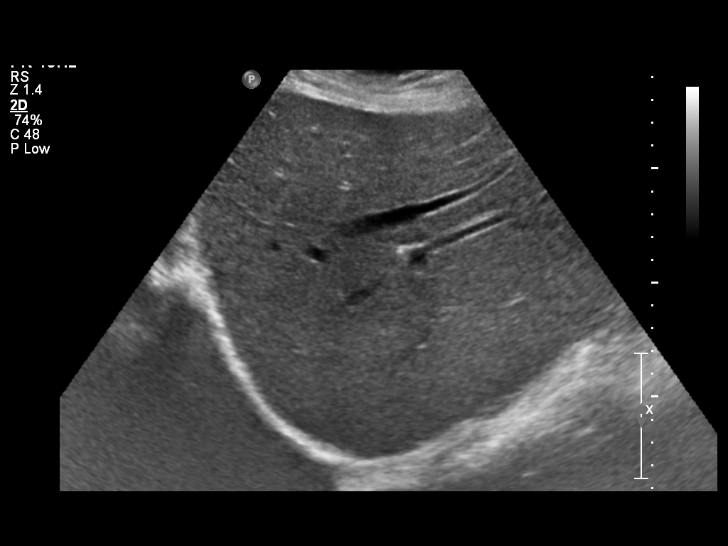
[im 32/86]
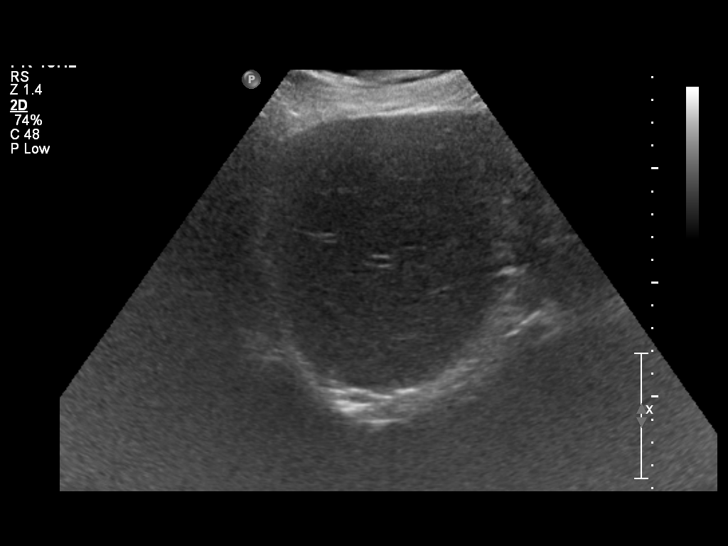
[im 39/86]
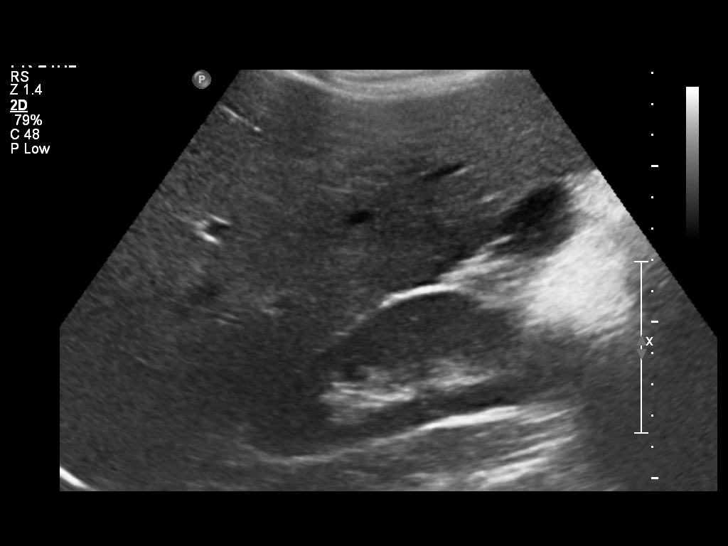
[im 47/86]
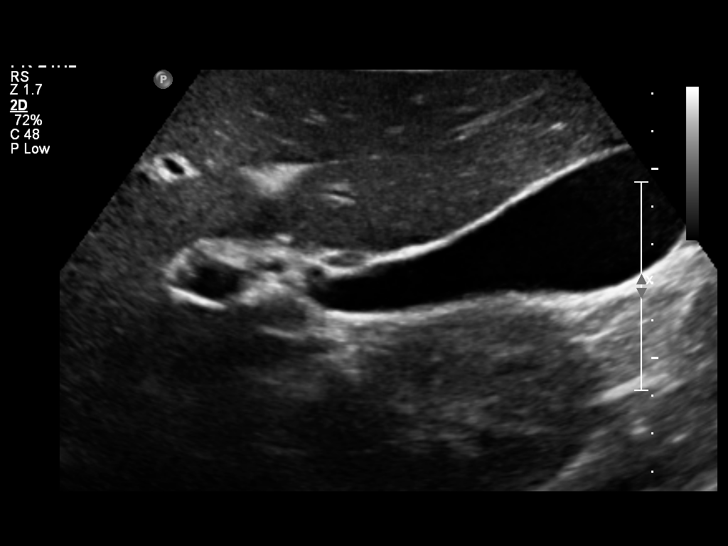
[im 54/86]
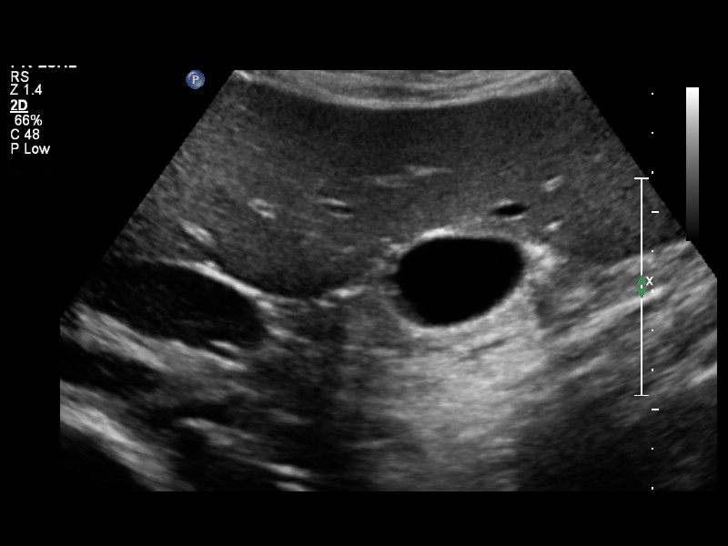
[im 57/86]
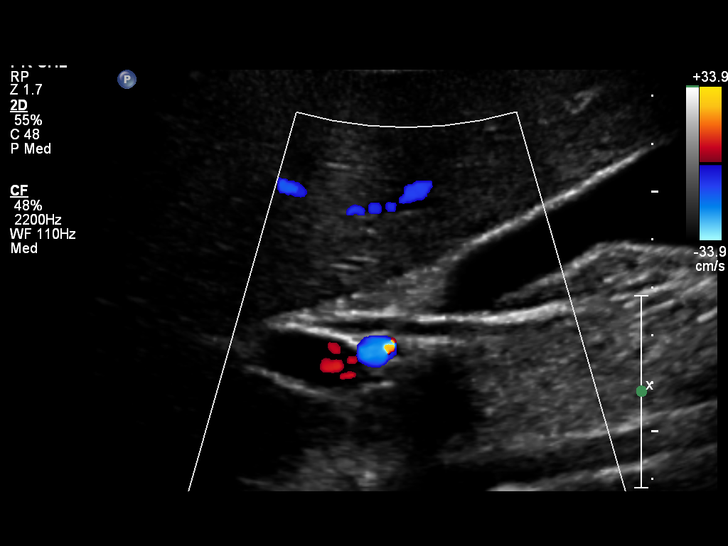
[im 64/86]
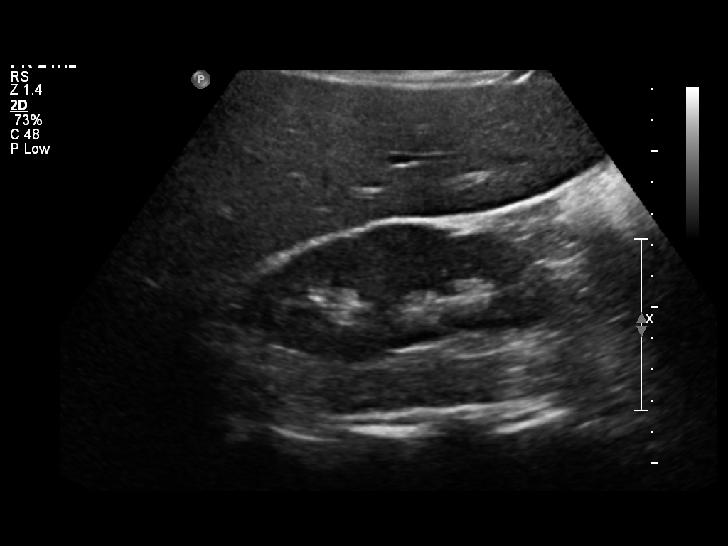
[im 71/86]
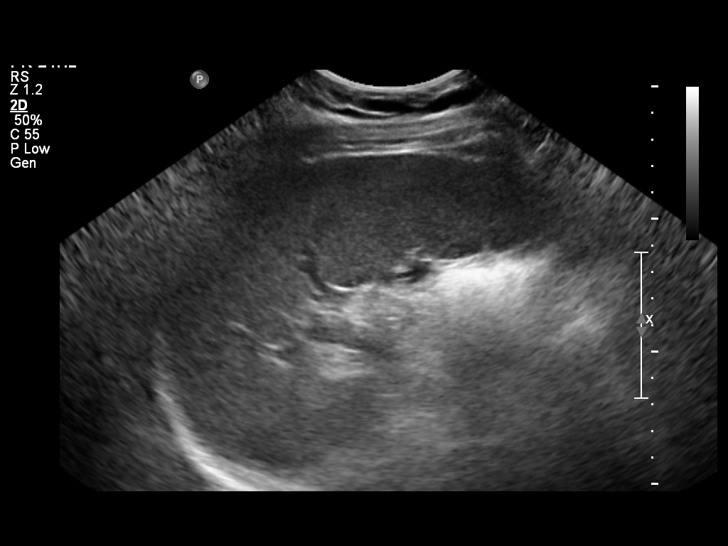
[im 78/86]
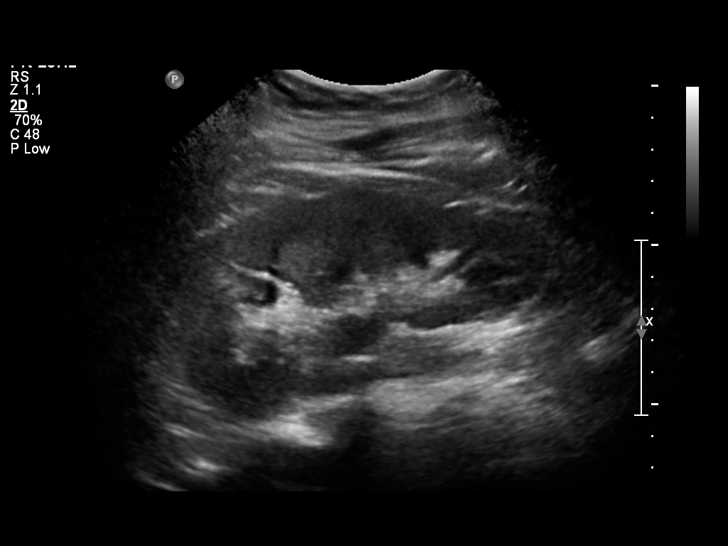
[im 86/86]
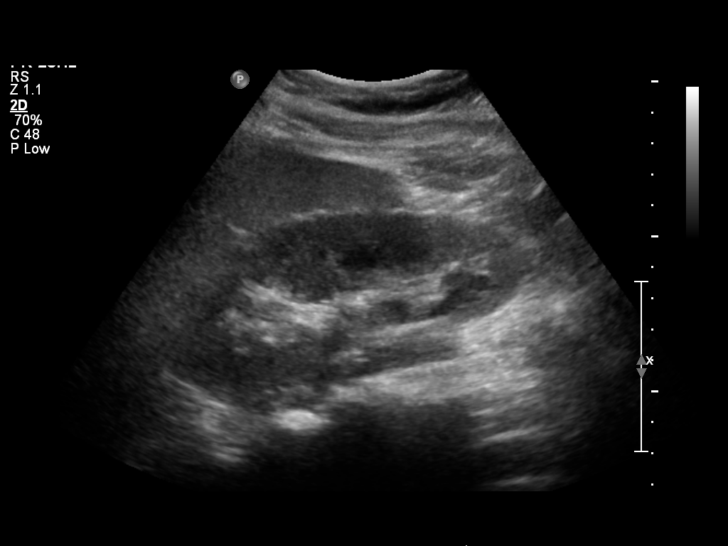

[14 of 25 positions shown; findings below may reference images not displayed]

FINDINGS: Gallbladder: No gallstones or wall thickening visualized. No
sonographic Murphy sign noted.

Common bile duct: Diameter: 3.6 mm

Liver: No focal lesion identified. Within normal limits in
parenchymal echogenicity.

IVC: No abnormality visualized.

Pancreas: Visualized portion unremarkable.

Spleen: 13.7 cm in length.  Volume is equal to 594 cc.

Right Kidney: Length: 11.6 cm. Echogenicity within normal limits. No
mass or hydronephrosis visualized.

Left Kidney: Length: 12 cm. Echogenicity within normal limits. No
mass or hydronephrosis visualized.

Abdominal aorta: No aneurysm visualized.

Other findings: None.
IMPRESSION: 1. No acute findings.
2. Splenomegaly.

## 2014-08-07 NOTE — MAU Note (Addendum)
Delivered 07/08/14 vaginally. Had shoulder dystocia and PP hemorrhage. Today C/O upper abdominal pain X 2 hours. States has been going on for a couple of weeks off and on. States pain is worse just prior to bowel movement. States she has noticed blood in her stools. States had a bowel movement this AM. C/O slight HA. Patient had D&C last Friday for retained POC.

## 2014-08-07 NOTE — MAU Provider Note (Signed)
Ana Hudson is a 24 y.o. G2P1011 SP SVD on 07/08/14 and D&C for retained POC on 07/29/14.  Pt called this morning in tears c/o upper abd pain. She denies eating today, but says she had pizza last night. No N/V, occasional bloody stool.  She report the pain increases with a BM and that she was told of a possible abd hernia   History     Patient Active Problem List   Diagnosis Date Noted  . Vaginal delivery 07/08/2014  . Postpartum hemorrhage 10/07/2014  . Shoulder dystocia 07/08/2014  . Chorioamnionitis 07/08/2014  . Severe obesity (BMI >= 40) 07/07/2014  . IBS (irritable bowel syndrome) - constipation and diarrhea 07/07/2014  . 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 Simple febrile seizure - at age 26 months, then again at 24 years of age 10/06/2014  . Clomid pregnancy 07/07/2014    Chief Complaint  Patient presents with  . Abdominal Pain   HPI  OB History    Gravida Para Term Preterm AB TAB SAB Ectopic Multiple Living   2 1 1  1  1   0 1      Past Medical History  Diagnosis Date  . Seizures   . Anemia   . Heart murmur   . Postpartum hemorrhage     Past Surgical History  Procedure Laterality Date  . Tonsillectomy and adenoidectomy    . Addnoids    . Dilation and curettage of uterus N/A 07/29/2014    Procedure: SUCTION DILATATION AND CURETTAGE;  Surgeon: Silverio LaySandra Rivard, MD;  Location: [facility];  Service: Gynecology;  Laterality: N/A;    History reviewed. No pertinent family history.  History  Substance Use Topics  . Smoking status: Former Games developermoker  . Smokeless tobacco: Never Used  . Alcohol Use: Yes     Comment: rare when not pregnant    Allergies:  Allergies  Allergen Reactions  . Nickel Rash    Prescriptions prior to admission  Medication Sig Dispense Refill Last Dose  . ferrous sulfate 325 (65 FE) MG tablet Take 1 tablet (325 mg total) by mouth 3 (three) times daily with meals. Take TID WC for 2 weeks then reduce to BID WC for 4  weeks. (Patient taking differently: Take 325 mg by mouth 2 (two) times daily with a meal. ) 90 tablet 3 08/06/2014 at Unknown time  . nitrofurantoin, macrocrystal-monohydrate, (MACROBID) 100 MG capsule Take 100 mg by mouth 2 (two) times daily.   08/06/2014 at Unknown time  . oxyCODONE-acetaminophen (PERCOCET/ROXICET) 5-325 MG per tablet Take 1-2 tablets by mouth every 6 (six) hours as needed (for pain scale equal to or greater than 7). 30 tablet 0 08/06/2014 at Unknown time  . pantoprazole (PROTONIX) 40 MG tablet Take 1 tablet (40 mg total) by mouth daily as needed (Heartburn, indigestion). 60 tablet 2 08/06/2014 at Unknown time  . Prenatal Vit-Fe Fumarate-FA (PRENATAL MULTIVITAMIN) TABS tablet Take 1 tablet by mouth daily at 12 noon.   08/06/2014 at Unknown time  . ibuprofen (ADVIL,MOTRIN) 600 MG tablet Take 1 tablet (600 mg total) by mouth every 6 (six) hours. (Patient not taking: Reported on 08/07/2014) 30 tablet 2 Past Week at Unknown time  . senna-docusate (SENOKOT-S) 8.6-50 MG per tablet Take 2 tablets by mouth at bedtime as needed for mild constipation. (Patient not taking: Reported on 08/07/2014) 30 tablet 0 07/28/2014 at Unknown time  . simethicone (MYLICON) 80 MG chewable tablet Chew 1 tablet (80 mg total) by mouth 4 (  four) times daily as needed for flatulence. (Patient not taking: Reported on 08/07/2014) 30 tablet 0 Past Month at Unknown time  . sodium phosphate (FLEET) 7-19 GM/118ML ENEM Place 133 mLs (1 enema total) rectally daily as needed for severe constipation. (Patient not taking: Reported on 08/07/2014) 5 Bottle 1 Past Month at Unknown time    ROS See HPI above, all other systems are negative  Physical Exam   Blood pressure 117/60, pulse 66, temperature 98 F (36.7 C), temperature source Oral, resp. rate 16, height  (1.727 m), weight 251 lb (113.853 kg), currently breastfeeding.  Physical Exam Ext:  WNL ABD: Soft, tender to palpation, no rebound or guarding SVE: deferred   ED  Course  Assessment: Center Up abd pain   Plan: CBC, CMP, abd [place] NPO Consulted with Dr. Maureen Ralphs Winifred Bodiford, CNM, MSN 08/07/2014. 12:46 PM

## 2014-08-07 NOTE — MAU Provider Note (Signed)
MAU Addendum Note  Results for orders placed or performed during the hospital encounter of 08/07/14 (from the past 24 hour(s))  Urinalysis, Routine w reflex microscopic     Status: Abnormal   Collection Time: 08/07/14 11:33 AM  Result Value Ref Range   Color, Urine YELLOW YELLOW   APPearance CLEAR CLEAR   Specific Gravity, Urine 1.025 1.005 - 1.030   pH 5.5 5.0 - 8.0   Glucose, UA NEGATIVE NEGATIVE mg/dL   Hgb urine dipstick SMALL (A) NEGATIVE   Bilirubin Urine NEGATIVE NEGATIVE   Ketones, ur NEGATIVE NEGATIVE mg/dL   Protein, ur NEGATIVE NEGATIVE mg/dL   Urobilinogen, UA 0.2 0.0 - 1.0 mg/dL   Nitrite NEGATIVE NEGATIVE   Leukocytes, UA TRACE (A) NEGATIVE  Urine microscopic-add on     Status: None   Collection Time: 08/07/14 11:33 AM  Result Value Ref Range   Squamous Epithelial / LPF RARE RARE   WBC, UA 3-6 <3 WBC/hpf   RBC / HPF 0-2 <3 RBC/hpf   Bacteria, UA RARE RARE   Urine-Other MUCOUS PRESENT   CBC with Differential     Status: Abnormal   Collection Time: 08/07/14  1:14 PM  Result Value Ref Range   WBC 6.3 4.0 - 10.5 K/uL   RBC 3.25 (L) 3.87 - 5.11 MIL/uL   Hemoglobin 9.6 (L) 12.0 - 15.0 g/dL   HCT 16.129.1 (L) 09.636.0 - 04.546.0 %   MCV 89.5 78.0 - 100.0 fL   MCH 29.5 26.0 - 34.0 pg   MCHC 33.0 30.0 - 36.0 g/dL   RDW 40.915.6 (H) 81.111.5 - 91.415.5 %   Platelets 242 150 - 400 K/uL   Neutrophils Relative % 57 43 - 77 %   Neutro Abs 3.6 1.7 - 7.7 K/uL   Lymphocytes Relative 31 12 - 46 %   Lymphs Abs 1.9 0.7 - 4.0 K/uL   Monocytes Relative 7 3 - 12 %   Monocytes Absolute 0.4 0.1 - 1.0 K/uL   Eosinophils Relative 4 0 - 5 %   Eosinophils Absolute 0.3 0.0 - 0.7 K/uL   Basophils Relative 1 0 - 1 %   Basophils Absolute 0.1 0.0 - 0.1 K/uL  Comprehensive metabolic panel     Status: Abnormal   Collection Time: 08/07/14  1:14 PM  Result Value Ref Range   Sodium 139 135 - 145 mmol/L   Potassium 4.3 3.5 - 5.1 mmol/L   Chloride 112 96 - 112 mmol/L   CO2 25 19 - 32 mmol/L   Glucose, Bld 96 70  - 99 mg/dL   BUN 11 6 - 23 mg/dL   Creatinine, Ser 7.820.72 0.50 - 1.10 mg/dL   Calcium 8.7 8.4 - 95.610.5 mg/dL   Total Protein 6.5 6.0 - 8.3 g/dL   Albumin 4.0 3.5 - 5.2 g/dL   AST 26 0 - 37 U/L   ALT 35 0 - 35 U/L   Alkaline Phosphatase 62 39 - 117 U/L   Total Bilirubin 1.0 0.3 - 1.2 mg/dL   GFR calc non Af Amer >90 >90 mL/min   GFR calc Af Amer >90 >90 mL/min   Anion gap 2 (L) 5 - 15   U Report: Gallbladder: No gallstones or wall thickening visualized. No sonographic Murphy sign noted.  Common bile duct: Diameter: 3.6 mm  Liver: No focal lesion identified. Within normal limits in parenchymal echogenicity.  IVC: No abnormality visualized.  Pancreas: Visualized portion unremarkable.  Spleen: 13.7 cm in length. Volume is equal to 594  cc. With upper normal being 411cc  Right Kidney: Length: 11.6 cm. Echogenicity within normal limits. No mass or hydronephrosis visualized.  Left Kidney: Length: 12 cm. Echogenicity within normal limits. No mass or hydronephrosis visualized.  Abdominal aorta: No aneurysm visualized.  Other findings: None.  IMPRESSION: 1. No acute findings. 2. Splenomegaly.   Plan: -FU with GI -Discharged to home in stable condition Consulted with Dr. Maureen Ralphs Ana Hudson, CNM, MSN 08/07/2014. 2:37 PM

## 2015-03-07 DIAGNOSIS — F419 Anxiety disorder, unspecified: Secondary | ICD-10-CM | POA: Insufficient documentation

## 2016-01-04 ENCOUNTER — Encounter (HOSPITAL_COMMUNITY): Payer: Self-pay | Admitting: Emergency Medicine

## 2016-01-04 ENCOUNTER — Emergency Department (HOSPITAL_COMMUNITY)
Admission: EM | Admit: 2016-01-04 | Discharge: 2016-01-04 | Disposition: A | Discharge status: Home / Self Care | Payer: Medicaid Other | Attending: Emergency Medicine | Admitting: Emergency Medicine

## 2016-01-04 DIAGNOSIS — K0889 Other specified disorders of teeth and supporting structures: Secondary | ICD-10-CM

## 2016-01-04 DIAGNOSIS — Z87891 Personal history of nicotine dependence: Secondary | ICD-10-CM | POA: Insufficient documentation

## 2016-01-04 MED ORDER — PENICILLIN V POTASSIUM 500 MG PO TABS
500.0000 mg | ORAL_TABLET | Freq: Four times a day (QID) | ORAL | Status: AC
Start: 2016-01-04 — End: 2016-01-11

## 2016-01-04 MED ORDER — PENICILLIN V POTASSIUM 250 MG PO TABS
500.0000 mg | ORAL_TABLET | Freq: Once | ORAL | Status: AC
  Administered 2016-01-04: 500 mg via ORAL
  Filled 2016-01-04: qty 2

## 2016-01-04 MED ORDER — IBUPROFEN 400 MG PO TABS
600.0000 mg | ORAL_TABLET | Freq: Once | ORAL | Status: AC
  Administered 2016-01-04: 600 mg via ORAL
  Filled 2016-01-04: qty 1

## 2016-01-04 NOTE — ED Provider Notes (Signed)
CSN: [id number]     Arrival date & time 01/04/16  0630 History   First MD Initiated Contact with Patient 01/04/16 [phone]     Chief Complaint  Patient presents with  . Dental Pain    Patient is a 25 y.o. female presenting with tooth pain. The history is provided by the patient.  Dental Pain Location:  Upper Severity:  Moderate Onset quality:  Gradual Timing:  Constant Progression:  Worsening Chronicity:  New Relieved by:  Nothing Worsened by:  Jaw movement and pressure Associated symptoms: facial pain   Associated symptoms: no difficulty swallowing and no fever     Past Medical History  Diagnosis Date  . Seizures (HCC)   . Anemia   . Heart murmur   . Postpartum hemorrhage    Past Surgical History  Procedure Laterality Date  . Tonsillectomy and adenoidectomy    . Addnoids    . Dilation and curettage of uterus N/A 07/29/2014    Procedure: SUCTION DILATATION AND CURETTAGE;  Surgeon: Silverio LaySandra Rivard, MD;  Location: [facility];  Service: Gynecology;  Laterality: N/A;   No family history on file. Social History  Substance Use Topics  . Smoking status: Former Games developermoker  . Smokeless tobacco: Never Used  . Alcohol Use: Yes     Comment: rare when not pregnant   OB History    Gravida Para Term Preterm AB TAB SAB Ectopic Multiple Living   2 1 1  1  1   0 1     Review of Systems  Constitutional: Negative for fever.  HENT: Negative for trouble swallowing.   Gastrointestinal: Negative for vomiting.      Allergies  Nickel  Home Medications   Prior to Admission medications   Medication Sig Start Date End Date Taking? Authorizing Provider  nitrofurantoin, macrocrystal-monohydrate, (MACROBID) 100 MG capsule Take 100 mg by mouth 2 (two) times daily. 08/06/14   Historical Provider, MD  oxyCODONE-acetaminophen (PERCOCET/ROXICET) 5-325 MG per tablet Take 1-2 tablets by mouth every 6 (six) hours as needed (for pain scale equal to or greater than 7). 07/10/14   Gerrit HeckJessica Emly, CNM  pantoprazole  (PROTONIX) 40 MG tablet Take 1 tablet (40 mg total) by mouth daily as needed (Heartburn, indigestion). 07/11/14   Sherre ScarletKimberly Williams, CNM  penicillin v potassium (VEETID) 500 MG tablet Take 1 tablet (500 mg total) by mouth 4 (four) times daily. 01/04/16 01/11/16  Zadie Rhineonald Deontrey Massi, MD  Prenatal Vit-Fe Fumarate-FA (PRENATAL MULTIVITAMIN) TABS tablet Take 1 tablet by mouth daily at 12 noon.    Historical Provider, MD   BP 139/83 mmHg  Pulse 59  Temp(Src) 97.4 F (36.3 C) (Oral)  Resp 16  Ht 5\' 8"  (1.727 m)  SpO2 100%  LMP 01/03/2016 (Approximate)  Breastfeeding? No Physical Exam CONSTITUTIONAL: Well developed/well nourished HEAD AND FACE: Normocephalic/atraumatic EYES: EOMI ENMT: Mucous membranes moist.    No trismus.  No focal abscess noted. Tenderness along right upper molar. Tenderness to gingiva No facial swelling or crepitus noted NECK: supple no meningeal signs CV: S1/S2 noted, no murmurs/rubs/gallops noted LUNGS: Lungs are clear to auscultation bilaterally, no apparent distress ABDOMEN: soft, nontender, no rebound or guarding NEURO: Pt is awake/alert, moves all extremitiesx4 EXTREMITIES:full ROM SKIN: warm, color normal  ED Course  Procedures  Medications  penicillin v potassium (VEETID) tablet 500 mg (500 mg Oral Given 01/04/16 [id number])  ibuprofen (ADVIL,MOTRIN) tablet 600 mg (600 mg Oral Given 01/04/16 [id number])    MDM   Final diagnoses:  Pain, dental    Nursing  notes including past medical history and social history reviewed and considered in documentation     Zadie Rhine, MD 01/04/16 [phone]

## 2016-01-04 NOTE — ED Notes (Signed)
Pt arrives with R upper dental pain worsening over the last couple of days, hx of broken wisdom tooth in same area. Ibuprofen moderately helpful for pain.

## 2016-03-17 DIAGNOSIS — Z8619 Personal history of other infectious and parasitic diseases: Secondary | ICD-10-CM

## 2016-03-17 HISTORY — DX: Personal history of other infectious and parasitic diseases: Z86.19

## 2016-03-27 ENCOUNTER — Inpatient Hospital Stay (HOSPITAL_COMMUNITY)
Admission: AD | Admit: 2016-03-27 | Discharge: 2016-03-27 | Disposition: A | Discharge status: Home / Self Care | Payer: Medicaid Other | Source: Ambulatory Visit | Attending: Obstetrics and Gynecology | Admitting: Obstetrics and Gynecology

## 2016-03-27 ENCOUNTER — Encounter (HOSPITAL_COMMUNITY): Payer: Self-pay | Admitting: *Deleted

## 2016-03-27 ENCOUNTER — Inpatient Hospital Stay (HOSPITAL_COMMUNITY): Payer: Medicaid Other

## 2016-03-27 DIAGNOSIS — O23591 Infection of other part of genital tract in pregnancy, first trimester: Secondary | ICD-10-CM | POA: Diagnosis not present

## 2016-03-27 DIAGNOSIS — Z3A01 Less than 8 weeks gestation of pregnancy: Secondary | ICD-10-CM | POA: Insufficient documentation

## 2016-03-27 DIAGNOSIS — A5901 Trichomonal vulvovaginitis: Secondary | ICD-10-CM | POA: Diagnosis not present

## 2016-03-27 DIAGNOSIS — Z87891 Personal history of nicotine dependence: Secondary | ICD-10-CM | POA: Insufficient documentation

## 2016-03-27 DIAGNOSIS — O98311 Other infections with a predominantly sexual mode of transmission complicating pregnancy, first trimester: Secondary | ICD-10-CM | POA: Insufficient documentation

## 2016-03-27 DIAGNOSIS — O26899 Other specified pregnancy related conditions, unspecified trimester: Secondary | ICD-10-CM

## 2016-03-27 DIAGNOSIS — R1032 Left lower quadrant pain: Secondary | ICD-10-CM | POA: Diagnosis present

## 2016-03-27 DIAGNOSIS — R109 Unspecified abdominal pain: Secondary | ICD-10-CM

## 2016-03-27 LAB — URINALYSIS, ROUTINE W REFLEX MICROSCOPIC
BILIRUBIN URINE: NEGATIVE
Glucose, UA: NEGATIVE mg/dL
HGB URINE DIPSTICK: NEGATIVE
KETONES UR: NEGATIVE mg/dL
Leukocytes, UA: NEGATIVE
Nitrite: NEGATIVE
Protein, ur: NEGATIVE mg/dL
Specific Gravity, Urine: >1.03 — ABNORMAL HIGH (ref 1.005–1.030)
pH: 5.5 (ref 5.0–8.0)

## 2016-03-27 LAB — CBC
HCT: 35.5 % — ABNORMAL LOW (ref 36.0–46.0)
HEMOGLOBIN: 12.7 g/dL (ref 12.0–15.0)
MCH: 30.2 pg (ref 26.0–34.0)
MCHC: 35.8 g/dL (ref 30.0–36.0)
MCV: 84.5 fL (ref 78.0–100.0)
Platelets: 210 10*3/uL (ref 150–400)
RBC: 4.2 MIL/uL (ref 3.87–5.11)
RDW: 12.7 % (ref 11.5–15.5)
WBC: 7.7 10*3/uL (ref 4.0–10.5)

## 2016-03-27 LAB — WET PREP, GENITAL
SPERM: NONE SEEN
Yeast Wet Prep HPF POC: NONE SEEN

## 2016-03-27 LAB — POCT PREGNANCY, URINE: PREG TEST UR: POSITIVE — AB

## 2016-03-27 LAB — HCG, QUANTITATIVE, PREGNANCY: HCG, BETA CHAIN, QUANT, S: 98327 m[IU]/mL — AB (ref <5)

## 2016-03-27 IMAGING — US US OB COMP LESS 14 WK
1 series · 15 of 28 positions shown · non-contrast
Comparison: CT of the abdomen and pelvis from [DATE]

CLINICAL DATA: Acute onset of left-sided pelvic pain and cramping.
Initial encounter.

EXAM:
OBSTETRIC <14 WK US AND TRANSVAGINAL OB US
TECHNIQUE: Both transabdominal and transvaginal ultrasound examinations were
performed for complete evaluation of the gestation as well as the
maternal uterus, adnexal regions, and pelvic cul-de-sac.
Transvaginal technique was performed to assess early pregnancy.

[Series 1: us ob comp less 14 wk · 15 of 61 slices shown]
[im 1/61]
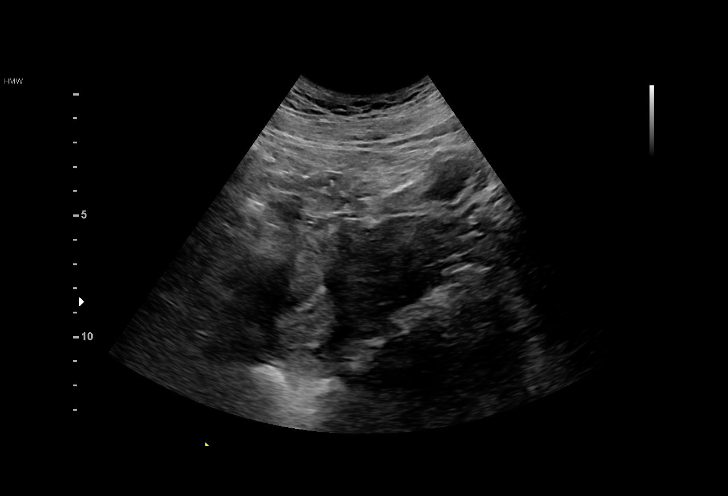
[im 5/61]
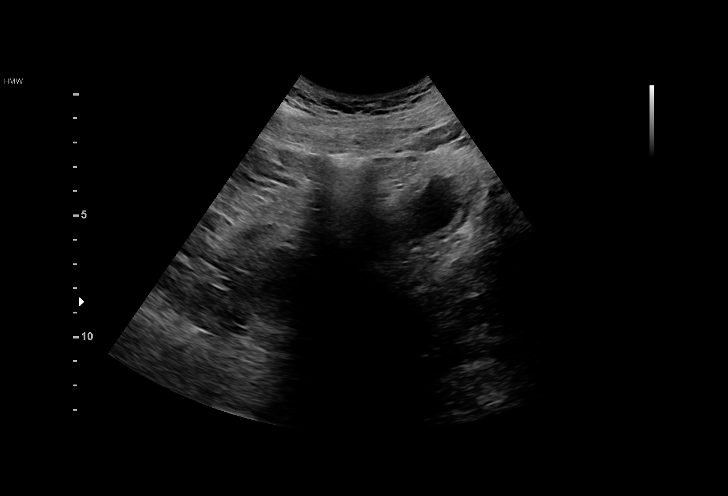
[im 9/61]
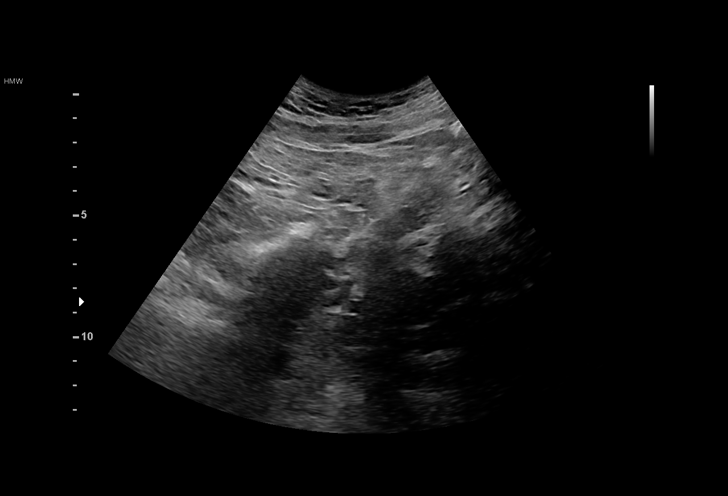
[im 14/61]
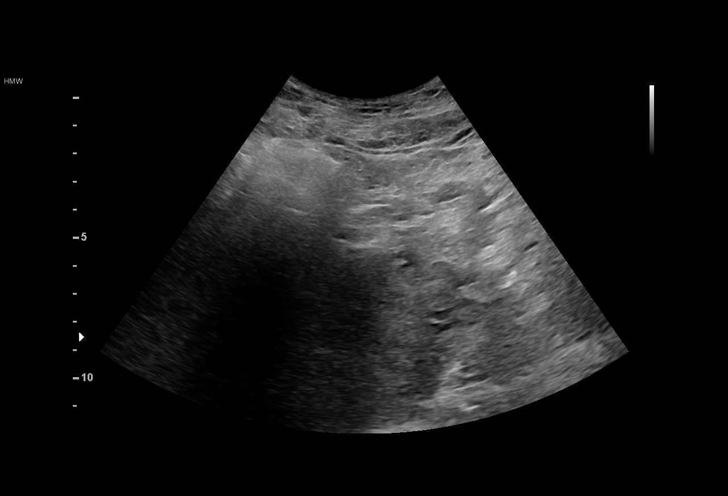
[im 18/61]
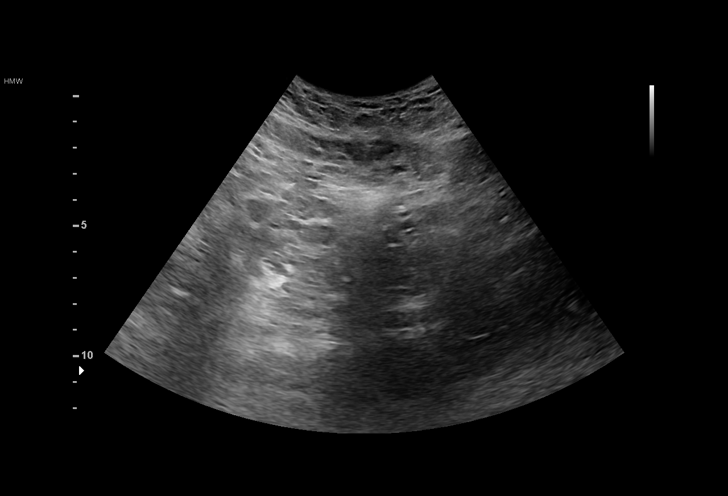
[im 23/61]
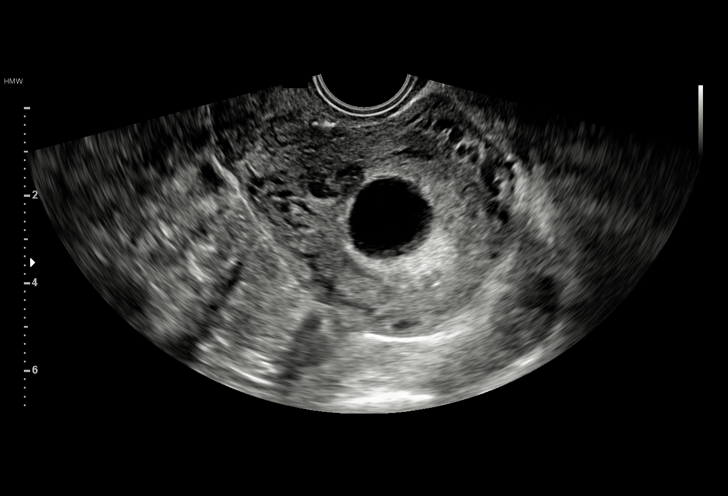
[im 27/61]
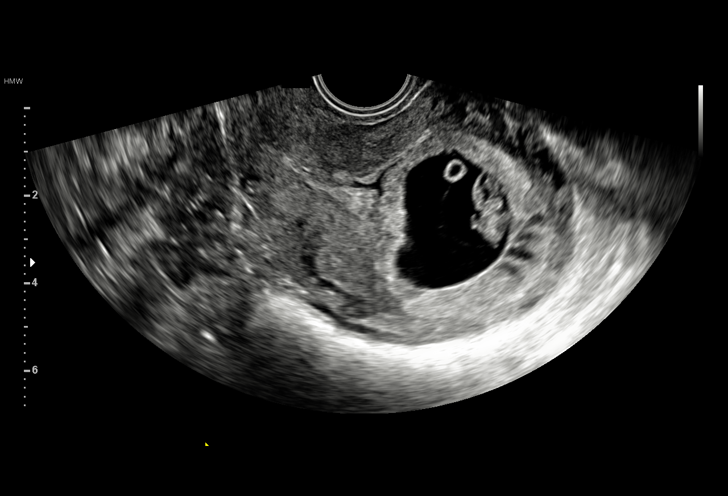
[im 32/61]
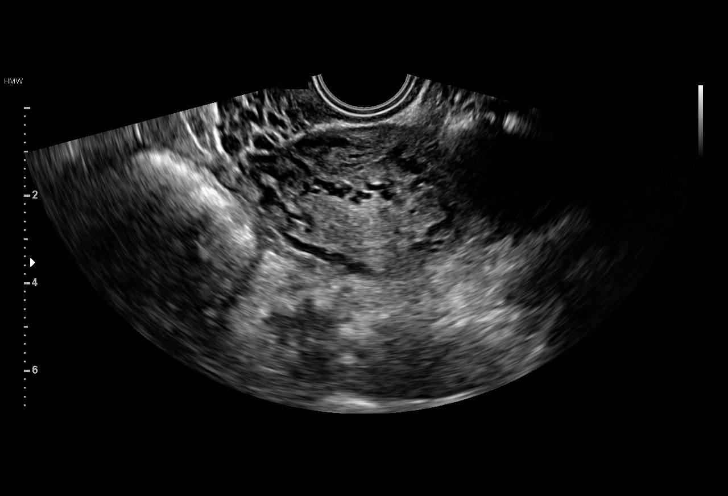
[im 34/61]
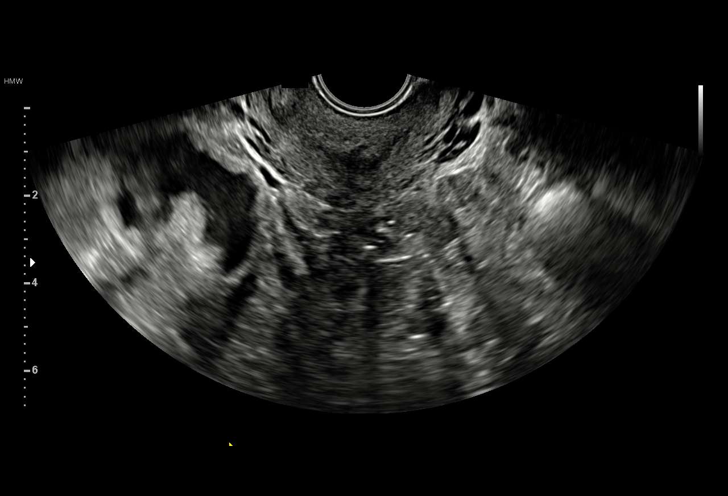
[im 38/61]
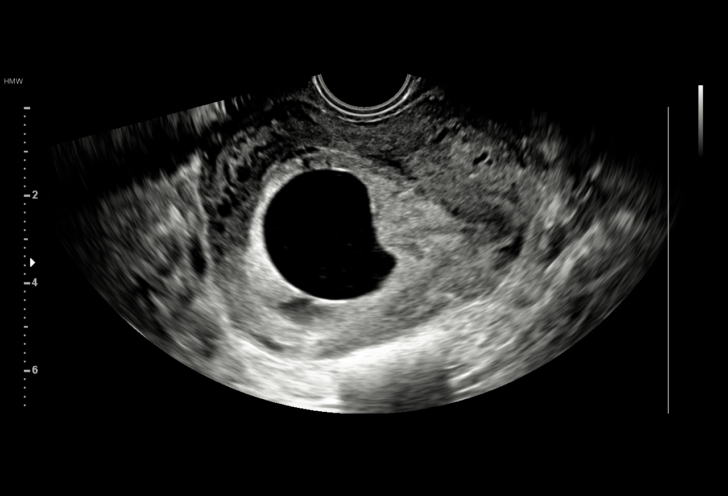
[im 43/61]
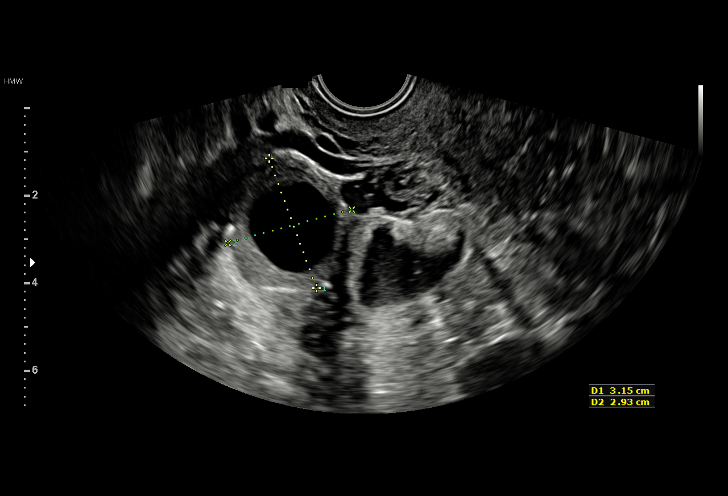
[im 47/61]
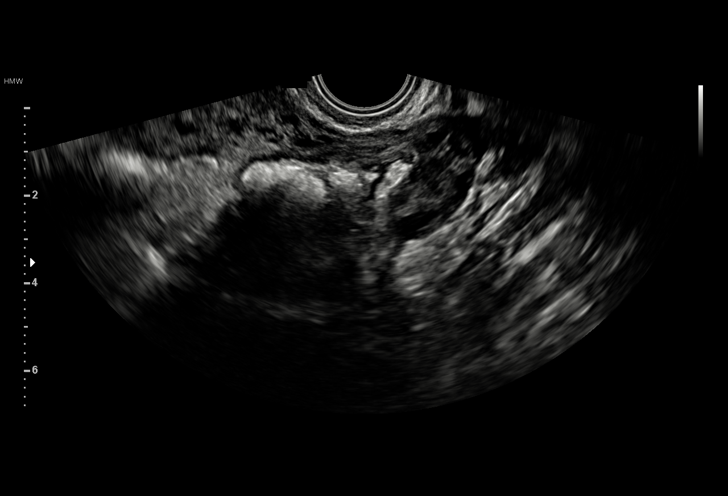
[im 52/61]
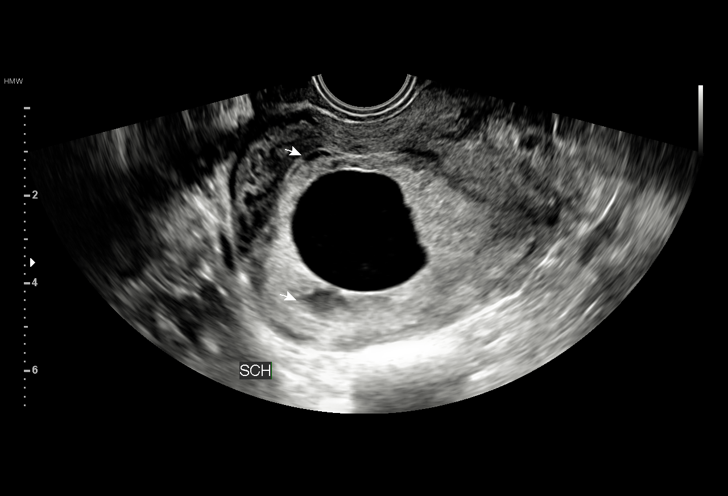
[im 56/61]
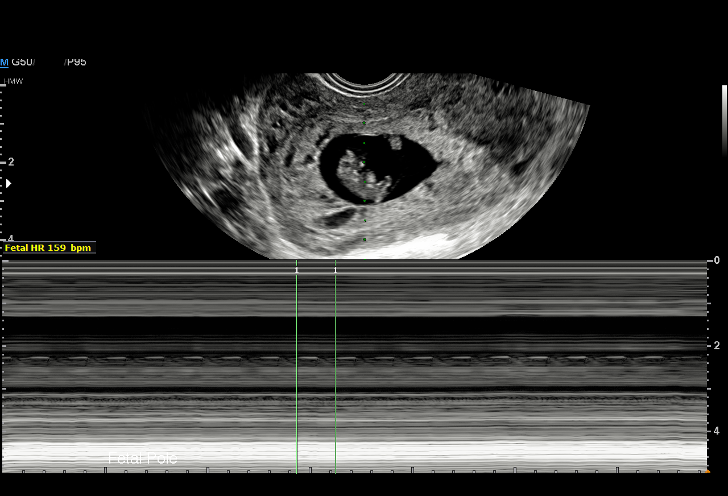
[im 61/61]
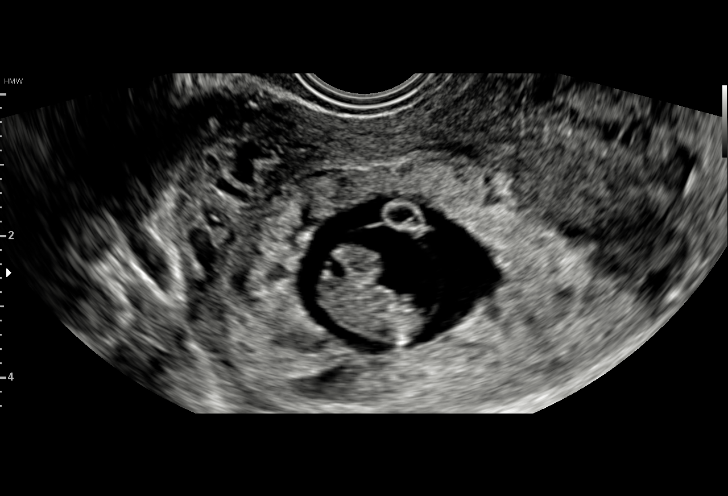

[15 of 28 positions shown; findings below may reference images not displayed]

FINDINGS: Intrauterine gestational sac: Single; visualized and normal in
shape.

Yolk sac:  Yes

Embryo:  Yes

Cardiac Activity: Yes

Heart Rate: 159  bpm

CRL:  1.63 cm   8 w   0 d                  US EDC: [DATE]

Subchorionic hemorrhage: A small amount of subchorionic hemorrhage
is noted.

Maternal uterus/adnexae: The uterus is otherwise unremarkable.

The ovaries are within normal limits. The right ovary measures 3.2 x
2.9 x 3.1 cm, while the left ovary measures 3.0 x 1.4 x 1.0 cm. No
suspicious adnexal masses are seen; there is no evidence for ovarian
torsion.

No free fluid is seen within the pelvic cul-de-sac.
IMPRESSION: 1. Single live intrauterine pregnancy noted, with a crown-rump
length of 1.6 cm, corresponding to a gestational age of 8 weeks 0
days. This matches the gestational age of 8 weeks 4 days by LMP,
reflecting an estimated date of delivery [DATE].
2. Small amount of subchorionic hemorrhage noted.

## 2016-03-27 MED ORDER — METRONIDAZOLE 500 MG PO TABS
2000.0000 mg | ORAL_TABLET | Freq: Once | ORAL | Status: AC
  Administered 2016-03-27: 2000 mg via ORAL
  Filled 2016-03-27: qty 4

## 2016-03-27 NOTE — MAU Note (Signed)
Pt reports she started having cramping earlier and then started having sharp pain in her left lower abd. LMP 01/27/2016, positive urine preg test at her MD's office.

## 2016-03-27 NOTE — MAU Provider Note (Signed)
Chief Complaint: Contractions   First Provider Initiated Contact with Patient 03/27/16 0237     SUBJECTIVE HPI: Ana Hudson is a 25 y.o. G3P1011 at 2784w4d by US at [facility] who presents to Maternity Admissions reporting low abd cramping x 1 week and severe sharp, LLQ pain while working tonight that resolved while driving to MAU. NOB visit scheduled 04/02/16. Pt states there was a cyst seen on US.   Modifying factors: Hasn't tried anything to Tx the pain.  Associated signs and symptoms: Pos for dysuria. Neg for fever, chills, vaginal bleeding, vaginal discharge, hematuria, flank pain.   Past Medical History:  Diagnosis Date  . Anemia   . Heart murmur   . Postpartum hemorrhage   . Seizures (HCC)    OB History  Gravida Para Term Preterm AB Living  3 1 1   1 1   SAB TAB Ectopic Multiple Live Births  1     0 1    # Outcome Date GA Lbr Len/2nd Weight Sex Delivery Anes PTL Lv  3 Current           2 Term 07/08/14 [id number] / 02:36 9 lb 11.2 oz (4.4 kg) M Vag-Spont EPI  LIV  1 SAB              Past Surgical History:  Procedure Laterality Date  . addnoids    . DILATION AND CURETTAGE OF UTERUS N/A 07/29/2014   Procedure: SUCTION DILATATION AND CURETTAGE;  Surgeon: Silverio LaySandra Rivard, MD;  Location: [facility];  Service: Gynecology;  Laterality: N/A;  . TONSILLECTOMY AND ADENOIDECTOMY     Social History   Social History  . Marital status: Single    Spouse name: N/A  . Number of children: N/A  . Years of education: N/A   Occupational History  . Not on file.   Social History Main Topics  . Smoking status: Former Games developermoker  . Smokeless tobacco: Never Used  . Alcohol use Yes     Comment: rare when not pregnant  . Drug use:     Types: Marijuana  . Sexual activity: Yes    Birth control/ protection: None   Other Topics Concern  . Not on file   Social History Narrative  . No narrative on file   No current facility-administered medications on file prior to encounter.    Current  Outpatient Prescriptions on File Prior to Encounter  Medication Sig Dispense Refill  . nitrofurantoin, macrocrystal-monohydrate, (MACROBID) 100 MG capsule Take 100 mg by mouth 2 (two) times daily.    Marland Kitchen. oxyCODONE-acetaminophen (PERCOCET/ROXICET) 5-325 MG per tablet Take 1-2 tablets by mouth every 6 (six) hours as needed (for pain scale equal to or greater than 7). 30 tablet 0  . pantoprazole (PROTONIX) 40 MG tablet Take 1 tablet (40 mg total) by mouth daily as needed (Heartburn, indigestion). 60 tablet 2  . Prenatal Vit-Fe Fumarate-FA (PRENATAL MULTIVITAMIN) TABS tablet Take 1 tablet by mouth daily at 12 noon.    . [DISCONTINUED] ferrous sulfate 325 (65 FE) MG tablet Take 1 tablet (325 mg total) by mouth 3 (three) times daily with meals. Take TID WC for 2 weeks then reduce to BID WC for 4 weeks. (Patient taking differently: Take 325 mg by mouth 2 (two) times daily with a meal. ) 90 tablet 3  . [DISCONTINUED] simethicone (MYLICON) 80 MG chewable tablet Chew 1 tablet (80 mg total) by mouth 4 (four) times daily as needed for flatulence. (Patient not taking: Reported on 08/07/2014) 30  tablet 0   Allergies  Allergen Reactions  . Nickel Rash    I have reviewed the past Medical Hx, Surgical Hx, Social Hx, Allergies and Medications.   Review of Systems  Constitutional: Negative for chills and fever.  Gastrointestinal: Positive for abdominal pain and nausea. Negative for constipation, diarrhea and vomiting.  Genitourinary: Positive for dysuria. Negative for flank pain, frequency, hematuria, urgency, vaginal bleeding and vaginal discharge.  Musculoskeletal: Negative for back pain.    OBJECTIVE Patient Vitals for the past 24 hrs:  BP Temp Temp src Pulse Resp SpO2 Height Weight  03/27/16 0208 104/60 98.1 F (36.7 C) Oral 60 16 99 % 5\' 8"  (1.727 m) 211 lb (95.7 kg)   Constitutional: Well-developed, well-nourished female in no acute distress.  Cardiovascular: normal rate Respiratory: normal rate and  effort.  GI: Abd soft, non-tender. Pos BS x 4 MS: Extremities nontender, no edema, normal ROM Neurologic: Alert and oriented x 4.  GU: Neg CVAT.  PELVIC EXAM: NEFG, physiologic discharge, no blood noted, cervix closed, uterus 8 week size, retroverted. no adnexal tenderness or masses. Mild CMT.  LAB RESULTS Results for orders placed or performed during the hospital encounter of 03/27/16 (from the past 24 hour(s))  Urinalysis, Routine w reflex microscopic (not at [facility])     Status: Abnormal   Collection Time: 03/27/16  2:10 AM  Result Value Ref Range   Color, Urine YELLOW YELLOW   APPearance CLEAR CLEAR   Specific Gravity, Urine >1.030 (H) 1.005 - 1.030   pH 5.5 5.0 - 8.0   Glucose, UA NEGATIVE NEGATIVE mg/dL   Hgb urine dipstick NEGATIVE NEGATIVE   Bilirubin Urine NEGATIVE NEGATIVE   Ketones, ur NEGATIVE NEGATIVE mg/dL   Protein, ur NEGATIVE NEGATIVE mg/dL   Nitrite NEGATIVE NEGATIVE   Leukocytes, UA NEGATIVE NEGATIVE  Pregnancy, urine POC     Status: Abnormal   Collection Time: 03/27/16  2:16 AM  Result Value Ref Range   Preg Test, Ur POSITIVE (A) NEGATIVE  CBC     Status: Abnormal   Collection Time: 03/27/16  2:33 AM  Result Value Ref Range   WBC 7.7 4.0 - 10.5 K/uL   RBC 4.20 3.87 - 5.11 MIL/uL   Hemoglobin 12.7 12.0 - 15.0 g/dL   HCT 16.1 (L) 09.6 - 04.5 %   MCV 84.5 78.0 - 100.0 fL   MCH 30.2 26.0 - 34.0 pg   MCHC 35.8 30.0 - 36.0 g/dL   RDW 40.9 81.1 - 91.4 %   Platelets 210 150 - 400 K/uL  Wet prep, genital     Status: Abnormal   Collection Time: 03/27/16  2:45 AM  Result Value Ref Range   Yeast Wet Prep HPF POC NONE SEEN NONE SEEN   Trich, Wet Prep PRESENT (A) NONE SEEN   Clue Cells Wet Prep HPF POC PRESENT (A) NONE SEEN   WBC, Wet Prep HPF POC MODERATE (A) NONE SEEN   Sperm NONE SEEN     IMAGING US Ob Comp Less 14 Wks  Result Date: 03/27/2016 CLINICAL DATA:  Acute onset of left-sided pelvic pain and cramping. Initial encounter. EXAM: OBSTETRIC <14 WK [place] AND  TRANSVAGINAL OB US TECHNIQUE: Both transabdominal and transvaginal ultrasound examinations were performed for complete evaluation of the gestation as well as the maternal uterus, adnexal regions, and pelvic cul-de-sac. Transvaginal technique was performed to assess early pregnancy. COMPARISON:  CT of the abdomen and pelvis from 07/27/2014 FINDINGS: Intrauterine gestational sac: Single; visualized and normal in shape. Yolk sac:  Yes Embryo:  Yes Cardiac Activity: Yes Heart Rate: 159  bpm CRL:  1.63 cm   8 w   0 d                  [place] EDC: 11/06/2016 Subchorionic hemorrhage: A small amount of subchorionic hemorrhage is noted. Maternal uterus/adnexae: The uterus is otherwise unremarkable. The ovaries are within normal limits. The right ovary measures 3.2 x 2.9 x 3.1 cm, while the left ovary measures 3.0 x 1.4 x 1.0 cm. No suspicious adnexal masses are seen; there is no evidence for ovarian torsion. No free fluid is seen within the pelvic cul-de-sac. IMPRESSION: 1. Single live intrauterine pregnancy noted, with a crown-rump length of 1.6 cm, corresponding to a gestational age of [redacted] weeks 0 days. This matches the gestational age of [redacted] weeks 4 days by LMP, reflecting an estimated date of delivery of Nov 02, 2016. 2. Small amount of subchorionic hemorrhage noted. Electronically Signed   By: Roanna Raider M.D.   On: 03/27/2016 03:29   US Ob Transvaginal  Result Date: 03/27/2016 CLINICAL DATA:  Acute onset of left-sided pelvic pain and cramping. Initial encounter. EXAM: OBSTETRIC <14 WK [place] AND TRANSVAGINAL OB US TECHNIQUE: Both transabdominal and transvaginal ultrasound examinations were performed for complete evaluation of the gestation as well as the maternal uterus, adnexal regions, and pelvic cul-de-sac. Transvaginal technique was performed to assess early pregnancy. COMPARISON:  CT of the abdomen and pelvis from 07/27/2014 FINDINGS: Intrauterine gestational sac: Single; visualized and normal in shape. Yolk sac:  Yes  Embryo:  Yes Cardiac Activity: Yes Heart Rate: 159  bpm CRL:  1.63 cm   8 w   0 d                  [place] EDC: 11/06/2016 Subchorionic hemorrhage: A small amount of subchorionic hemorrhage is noted. Maternal uterus/adnexae: The uterus is otherwise unremarkable. The ovaries are within normal limits. The right ovary measures 3.2 x 2.9 x 3.1 cm, while the left ovary measures 3.0 x 1.4 x 1.0 cm. No suspicious adnexal masses are seen; there is no evidence for ovarian torsion. No free fluid is seen within the pelvic cul-de-sac. IMPRESSION: 1. Single live intrauterine pregnancy noted, with a crown-rump length of 1.6 cm, corresponding to a gestational age of [redacted] weeks 0 days. This matches the gestational age of [redacted] weeks 4 days by LMP, reflecting an estimated date of delivery of Nov 02, 2016. 2. Small amount of subchorionic hemorrhage noted. Electronically Signed   By: Roanna Raider M.D.   On: 03/27/2016 03:29    MAU COURSE CBC, Quant, ABO/Rh, ultrasound, wet prep and GC/chlamydia culture, UA.  Dx Trich. Flagyl given.   MDM Pain in early pregnancy with normal intrauterine pregnancy and hemodynamically stable. Pain likely 2/2 trichomonas infection.   ASSESSMENT 1. Trichomonal vaginitis during pregnancy in first trimester   2. Abdominal pain affecting pregnancy     PLAN Discharge home in stable condition. First trimester precautions Partner need Tx. Than no IC until 1 week after both are Tx'd. GC/Chlamydia, urine culture, HIV pending.  Follow-up Information    [facility] Follow up on 04/02/2016.   Why:  as scheduled        [facility] [place] [facility] .   Why:  as needed in emergencies Contact information: [address] [id number] mc [place] [address] [phone]           Medication List    STOP  taking these medications   nitrofurantoin (macrocrystal-monohydrate) 100 MG capsule Commonly known as:  MACROBID    oxyCODONE-acetaminophen 5-325 MG tablet Commonly known as:  PERCOCET/ROXICET   pantoprazole 40 MG tablet Commonly known as:  PROTONIX     TAKE these medications   prenatal multivitamin Tabs tablet Take 1 tablet by mouth daily at 12 noon.      Goehner, CNM 03/27/2016  3:27 AM  4

## 2016-03-27 NOTE — Discharge Instructions (Signed)
Trichomoniasis Trichomoniasis is an infection caused by an organism called Trichomonas. The infection can affect both women and men. In women, the outer female genitalia and the vagina are affected. In men, the penis is mainly affected, but the prostate and other reproductive organs can also be involved. Trichomoniasis is a sexually transmitted infection (STI) and is most often passed to another person through sexual contact.  RISK FACTORS  Having unprotected sexual intercourse.  Having sexual intercourse with an infected partner. SIGNS AND SYMPTOMS  Symptoms of trichomoniasis in women include:  Abnormal gray-green frothy vaginal discharge.  Itching and irritation of the vagina.  Itching and irritation of the area outside the vagina. Symptoms of trichomoniasis in men include:   Penile discharge with or without pain.  Pain during urination. This results from inflammation of the urethra. DIAGNOSIS  Trichomoniasis may be found during a Pap test or physical exam. Your health care provider may use one of the following methods to help diagnose this infection:  Testing the pH of the vagina with a test tape.  Using a vaginal swab test that checks for the Trichomonas organism. A test is available that provides results within a few minutes.  Examining a urine sample.  Testing vaginal secretions. Your health care provider may test you for other STIs, including HIV. TREATMENT   You may be given medicine to fight the infection. Women should inform their health care provider if they could be or are pregnant. Some medicines used to treat the infection should not be taken during pregnancy.  Your health care provider may recommend over-the-counter medicines or creams to decrease itching or irritation.  Your sexual partner will need to be treated if infected.  Your health care provider may test you for infection again 3 months after treatment. HOME CARE INSTRUCTIONS   Take medicines only as  directed by your health care provider.  Take over-the-counter medicine for itching or irritation as directed by your health care provider.  Do not have sexual intercourse while you have the infection.  Women should not douche or wear tampons while they have the infection.  Discuss your infection with your partner. Your partner may have gotten the infection from you, or you may have gotten it from your partner.  Have your sex partner get examined and treated if necessary.  Practice safe, informed, and protected sex.  See your health care provider for other STI testing. SEEK MEDICAL CARE IF:   You still have symptoms after you finish your medicine.  You develop abdominal pain.  You have pain when you urinate.  You have bleeding after sexual intercourse.  You develop a rash.  Your medicine makes you sick or makes you throw up (vomit). MAKE SURE YOU:  Understand these instructions.  Will watch your condition.  Will get help right away if you are not doing well or get worse.   This information is not intended to replace advice given to you by your health care provider. Make sure you discuss any questions you have with your health care provider.   Document Released: 11/27/2000 Document Revised: 06/24/2014 Document Reviewed: 03/15/2013 Elsevier Interactive Patient Education 2016 Elsevier Inc.  

## 2016-03-27 NOTE — MAU Note (Deleted)
Patient presents with ctx every 3 mins. Patient also PROM. Patient denies bleeding. Fetus active.

## 2016-03-28 LAB — CULTURE, OB URINE: Special Requests: NORMAL

## 2016-03-28 LAB — GC/CHLAMYDIA PROBE AMP (~~LOC~~) NOT AT ARMC
Chlamydia: NEGATIVE
NEISSERIA GONORRHEA: NEGATIVE

## 2016-03-28 LAB — HIV ANTIBODY (ROUTINE TESTING W REFLEX): HIV Screen 4th Generation wRfx: NONREACTIVE

## 2016-04-27 ENCOUNTER — Inpatient Hospital Stay (HOSPITAL_COMMUNITY): Payer: Medicaid Other

## 2016-04-27 ENCOUNTER — Inpatient Hospital Stay (HOSPITAL_COMMUNITY)
Admission: AD | Admit: 2016-04-27 | Discharge: 2016-04-27 | Disposition: A | Discharge status: Home / Self Care | Payer: Medicaid Other | Source: Ambulatory Visit | Attending: Obstetrics & Gynecology | Admitting: Obstetrics & Gynecology

## 2016-04-27 ENCOUNTER — Encounter (HOSPITAL_COMMUNITY): Payer: Self-pay

## 2016-04-27 DIAGNOSIS — O99211 Obesity complicating pregnancy, first trimester: Secondary | ICD-10-CM | POA: Diagnosis not present

## 2016-04-27 DIAGNOSIS — O26892 Other specified pregnancy related conditions, second trimester: Secondary | ICD-10-CM

## 2016-04-27 DIAGNOSIS — R109 Unspecified abdominal pain: Secondary | ICD-10-CM | POA: Diagnosis present

## 2016-04-27 DIAGNOSIS — Z3A12 12 weeks gestation of pregnancy: Secondary | ICD-10-CM | POA: Insufficient documentation

## 2016-04-27 DIAGNOSIS — Z87891 Personal history of nicotine dependence: Secondary | ICD-10-CM | POA: Insufficient documentation

## 2016-04-27 DIAGNOSIS — O99210 Obesity complicating pregnancy, unspecified trimester: Secondary | ICD-10-CM

## 2016-04-27 DIAGNOSIS — Z202 Contact with and (suspected) exposure to infections with a predominantly sexual mode of transmission: Secondary | ICD-10-CM

## 2016-04-27 DIAGNOSIS — IMO0002 Reserved for concepts with insufficient information to code with codable children: Secondary | ICD-10-CM

## 2016-04-27 DIAGNOSIS — O26899 Other specified pregnancy related conditions, unspecified trimester: Secondary | ICD-10-CM

## 2016-04-27 HISTORY — DX: Personal history of other specified conditions: Z87.898

## 2016-04-27 HISTORY — DX: Personal history of other infectious and parasitic diseases: Z86.19

## 2016-04-27 LAB — WET PREP, GENITAL
CLUE CELLS WET PREP: NONE SEEN
Sperm: NONE SEEN
TRICH WET PREP: NONE SEEN
YEAST WET PREP: NONE SEEN

## 2016-04-27 LAB — URINALYSIS, ROUTINE W REFLEX MICROSCOPIC
BILIRUBIN URINE: NEGATIVE
Glucose, UA: NEGATIVE mg/dL
HGB URINE DIPSTICK: NEGATIVE
Ketones, ur: NEGATIVE mg/dL
Leukocytes, UA: NEGATIVE
Nitrite: NEGATIVE
PH: 5.5 (ref 5.0–8.0)
Protein, ur: NEGATIVE mg/dL
Specific Gravity, Urine: >1.03 — ABNORMAL HIGH (ref 1.005–1.030)

## 2016-04-27 IMAGING — US US OB COMP LESS 14 WK
1 series · 15 of 24 positions shown · non-contrast
Comparison: [DATE]

CLINICAL DATA: Pelvic and abdominal pain for 2 weeks. Negative
fetal heart tones. Estimated gestational age by LMP is 12 weeks 0
days. Quantitative beta HCG was not ordered.

EXAM:
OBSTETRIC <14 WK ULTRASOUND
TECHNIQUE: Transabdominal ultrasound was performed for evaluation of the
gestation as well as the maternal uterus and adnexal regions.

[Series 1: us ob comp less 14 wk · 24 acquisitions, 15 frames shown]
[im 1/24]
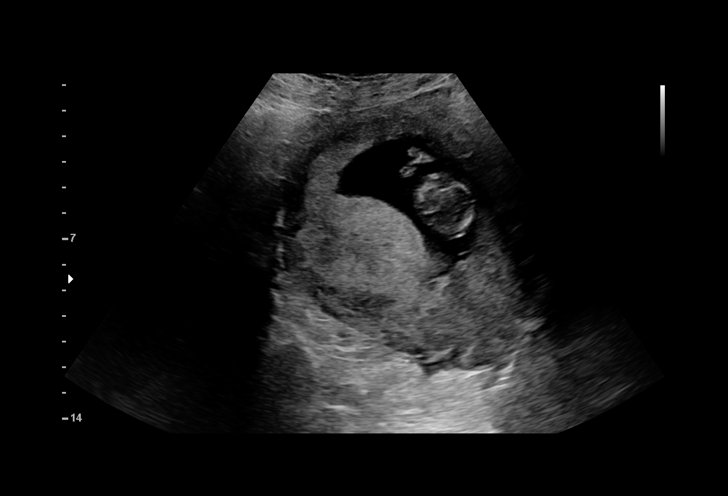
[im 3/24]
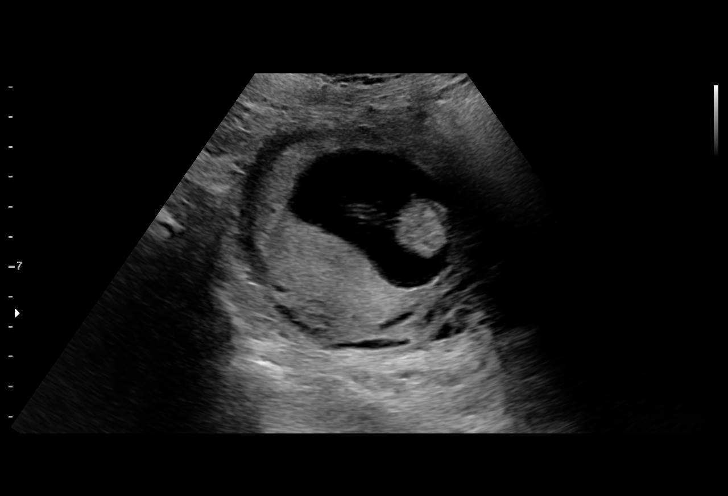
[im 5/24]
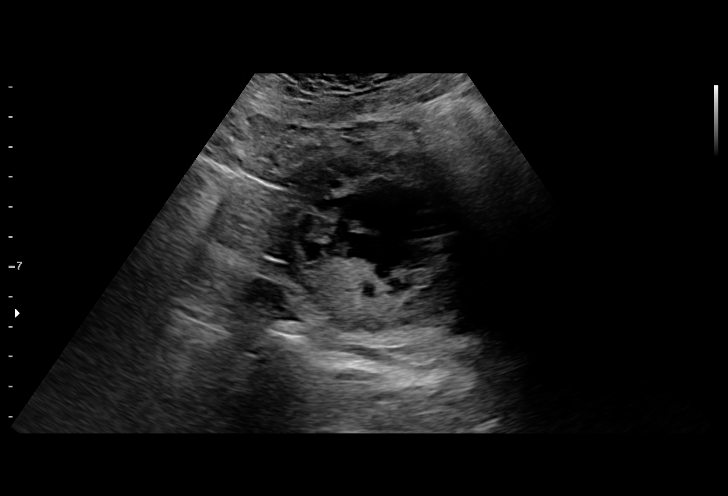
[im 6/24]
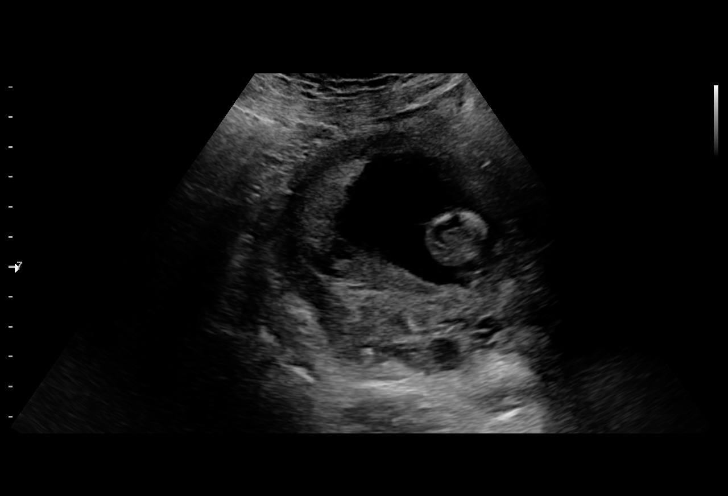
[im 8/24]
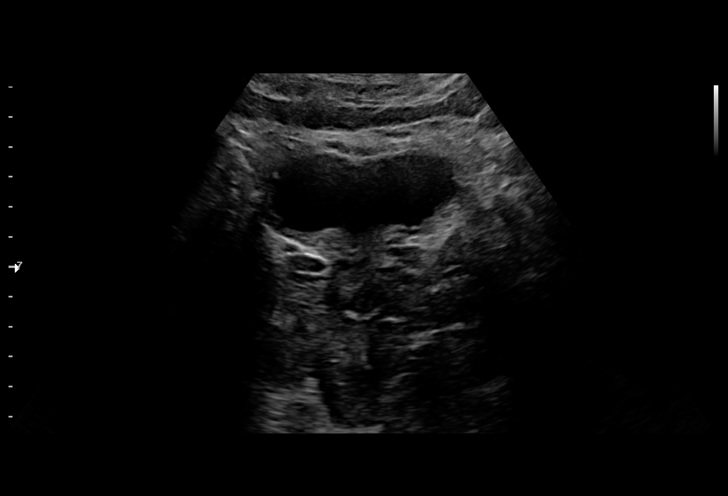
[im 9/24]
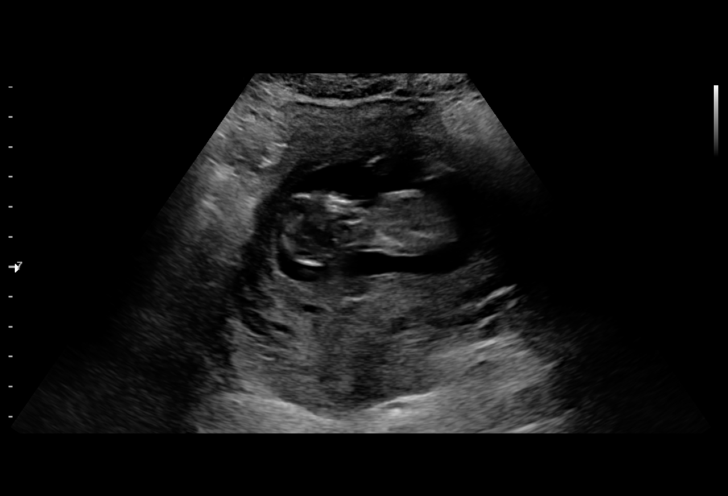
[im 11/24]
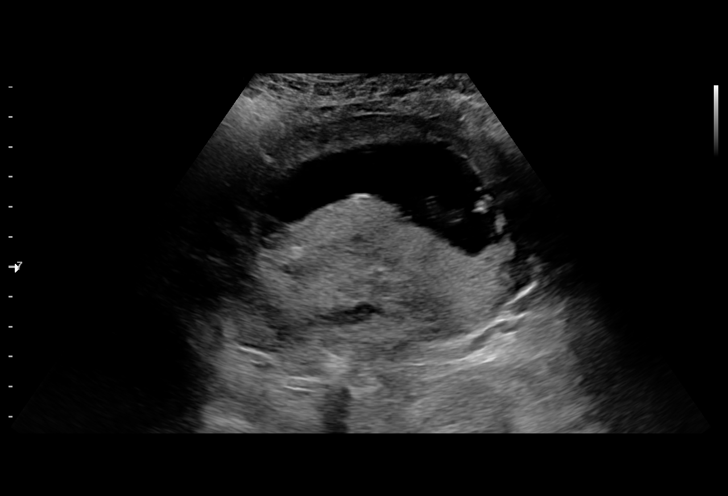
[im 13/24]
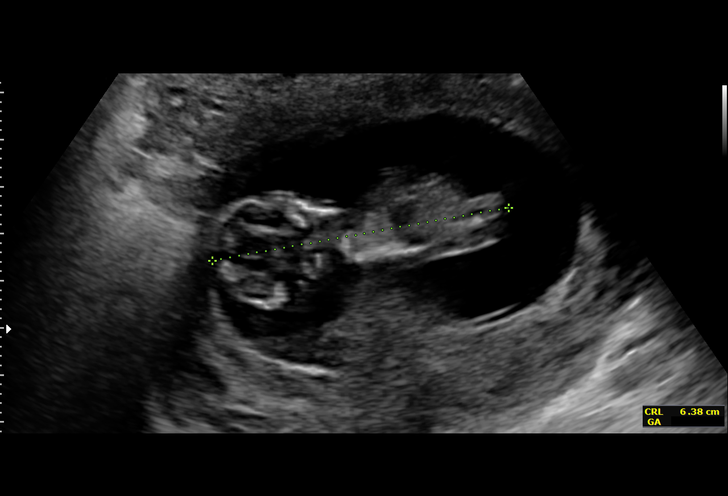
[im 14/24]
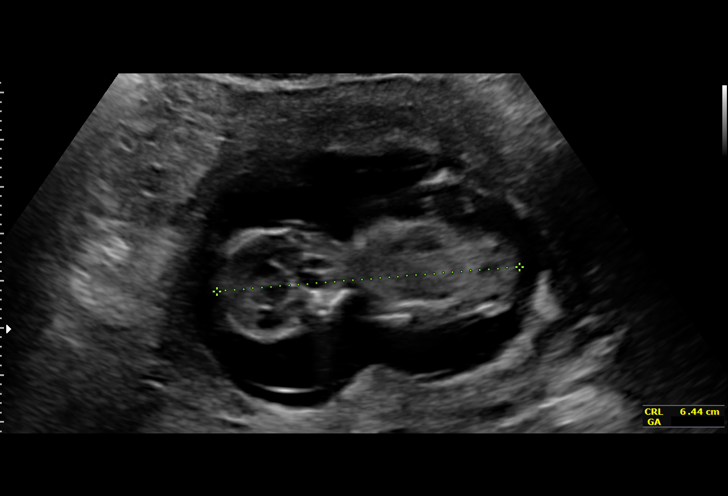
[im 16/24]
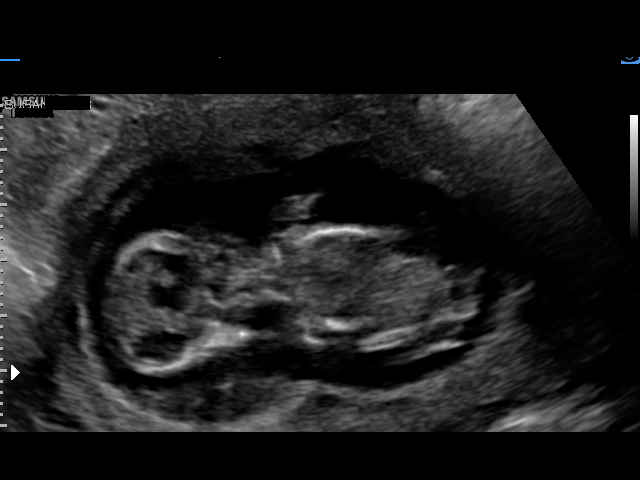
[im 17/24]
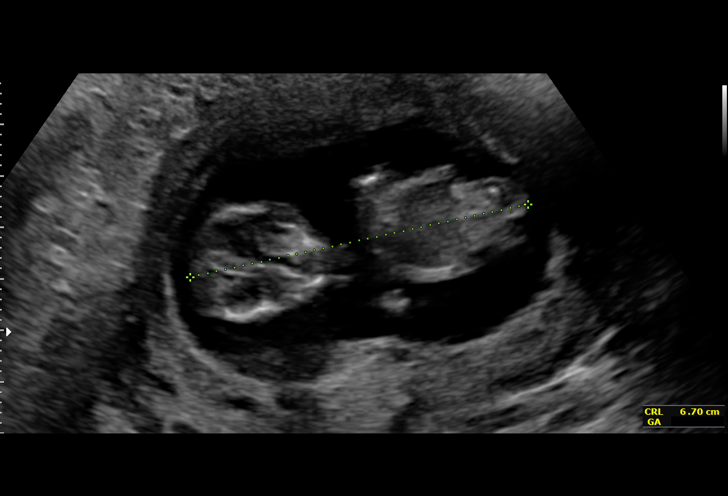
[im 19/24]
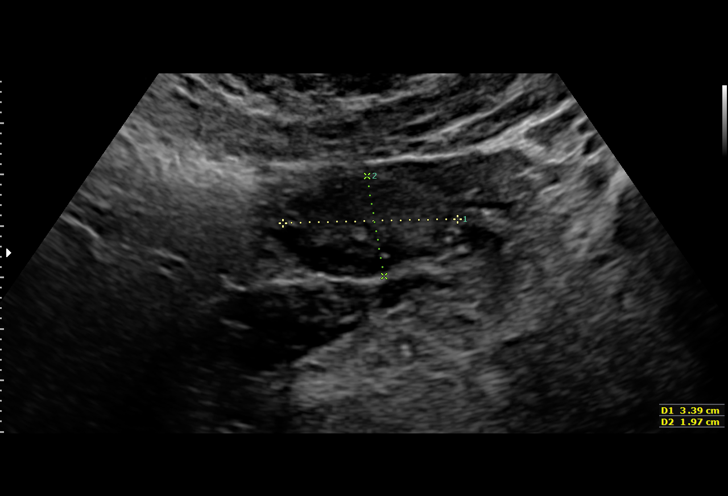
[im 21/24]
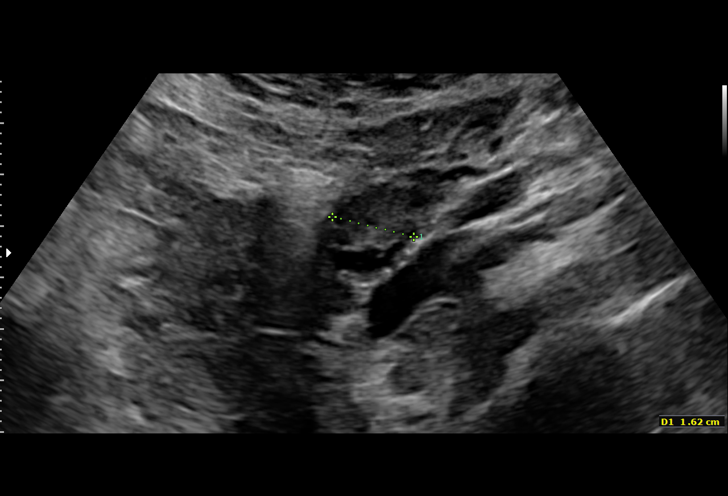
[im 22/24]
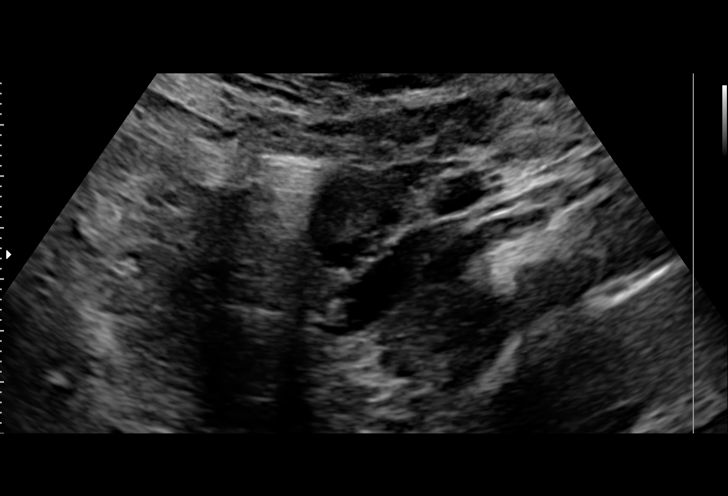
[im 24/24]
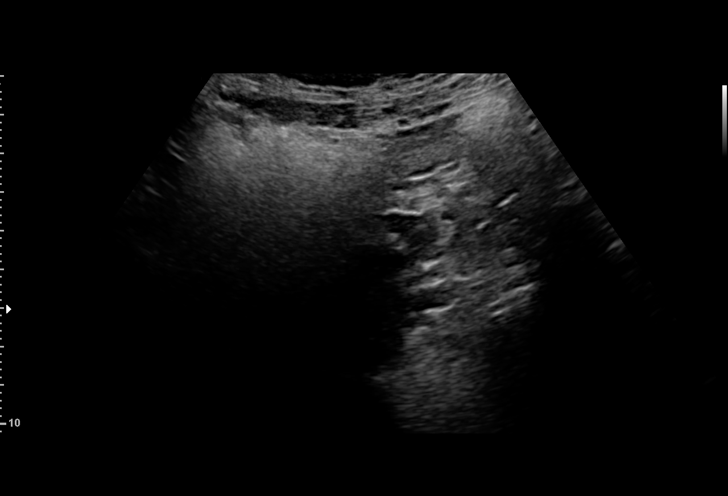

[15 of 24 positions shown; findings below may reference images not displayed]

FINDINGS: Intrauterine gestational sac: A single intrauterine gestational sac
is present.

Yolk sac: Yolk sac is not identified consistent with gestational
age.

Embryo:  Fetal pole is present.

Cardiac Activity: Fetal cardiac activity is observed.

Heart Rate: 157 bpm

CRL:   65.1  mm   12 w 6 d                  US EDC: [DATE]

Subchorionic hemorrhage:  None visualized.

Maternal uterus/adnexae: Uterus is anteverted. No myometrial mass
lesions identified. Right ovary is not visualized. Left ovary is
visualized and appears normal. No free fluid in the pelvis.
IMPRESSION: Single intrauterine pregnancy. Estimated gestational age by
crown-rump length is 12 weeks 6 days. This represents appropriate
interval growth since the previous study in correlates well with
maternal dates. No acute complication suggested on ultrasound.

## 2016-04-27 MED ORDER — METRONIDAZOLE 500 MG PO TABS
2000.0000 mg | ORAL_TABLET | Freq: Once | ORAL | Status: AC
  Administered 2016-04-27: 2000 mg via ORAL
  Filled 2016-04-27: qty 4

## 2016-04-27 NOTE — MAU Provider Note (Signed)
History     CSN: [id number]  Arrival date and time: 04/27/16 0000   First Provider Initiated Contact with Patient 04/27/16 0056      Chief Complaint  Patient presents with  . Abdominal Pain   HPI Ana Hudson is a 25 y.o. G3P1011 at 264w0d who presents with abdominal pain. Symptoms began 2 weeks ago and have gradually worsened. Reports lower abdomen/pelvic pain that is intermittent and cramp like. Pain radiates to bilateral hips and low back. Rates pain 7/10. Has taken tylenol for pain without relief. Pain worse with certain movements. Pt was treated for trichomonas during last MAU visit in October. States partner was not treated and she has had intercourse with him.  Denies vaginal discharge, vaginal bleeding, dysuria, fever, or n/v. Endorses 2 episodes of watery diarrhea tonight. Has history of IBS but states this doesn't follow her normal IBS pattern.    OB History    Gravida Para Term Preterm AB Living   3 1 1   1 1    SAB TAB Ectopic Multiple Live Births   1     0 1      Past Medical History:  Diagnosis Date  . Anemia   . Heart murmur   . Postpartum hemorrhage   . Seizures (HCC)     Past Surgical History:  Procedure Laterality Date  . addnoids    . DILATION AND CURETTAGE OF UTERUS N/A 07/29/2014   Procedure: SUCTION DILATATION AND CURETTAGE;  Surgeon: Silverio LaySandra Rivard, MD;  Location: [facility];  Service: Gynecology;  Laterality: N/A;  . TONSILLECTOMY AND ADENOIDECTOMY      History reviewed. No pertinent family history.  Social History  Substance Use Topics  . Smoking status: Former Games developermoker  . Smokeless tobacco: Never Used  . Alcohol use Yes     Comment: rare when not pregnant    Allergies:  Allergies  Allergen Reactions  . Nickel Rash    Prescriptions Prior to Admission  Medication Sig Dispense Refill Last Dose  . Prenatal Vit-Fe Fumarate-FA (PRENATAL MULTIVITAMIN) TABS tablet Take 1 tablet by mouth daily at 12 noon.   08/06/2014 at Unknown time    Review of  Systems  Constitutional: Negative.   Gastrointestinal: Positive for abdominal pain and diarrhea. Negative for blood in stool, constipation, nausea and vomiting.  Genitourinary: Negative.    Physical Exam   Blood pressure 115/64, pulse 66, temperature 98 F (36.7 C), resp. rate 20, height 5\' 8"  (1.727 m), weight 216 lb 6.4 oz (98.2 kg), last menstrual period 01/27/2016, SpO2 100 %, not currently breastfeeding.  Physical Exam  Nursing note and vitals reviewed. Constitutional: She is oriented to person, place, and time. She appears well-developed and well-nourished. No distress.  HENT:  Head: Normocephalic and atraumatic.  Eyes: Conjunctivae are normal. Right eye exhibits no discharge. Left eye exhibits no discharge. No scleral icterus.  Neck: Normal range of motion.  Respiratory: Effort normal. No respiratory distress.  GI: Soft. She exhibits no distension. There is no tenderness. There is no rebound and no guarding.  Genitourinary: Cervix exhibits motion tenderness.  Genitourinary Comments: Cervix closed  Neurological: She is alert and oriented to person, place, and time.  Skin: Skin is warm and dry. She is not diaphoretic.  Psychiatric: She has a normal mood and affect. Her behavior is normal. Judgment and thought content normal.    MAU Course  Procedures Results for orders placed or performed during the hospital encounter of 04/27/16 (from the past 24 hour(s))  Urinalysis,  Routine w reflex microscopic (not at [facility])     Status: Abnormal   Collection Time: 04/27/16 12:15 AM  Result Value Ref Range   Color, Urine YELLOW YELLOW   APPearance CLEAR CLEAR   Specific Gravity, Urine >1.030 (H) 1.005 - 1.030   pH 5.5 5.0 - 8.0   Glucose, UA NEGATIVE NEGATIVE mg/dL   Hgb urine dipstick NEGATIVE NEGATIVE   Bilirubin Urine NEGATIVE NEGATIVE   Ketones, ur NEGATIVE NEGATIVE mg/dL   Protein, ur NEGATIVE NEGATIVE mg/dL   Nitrite NEGATIVE NEGATIVE   Leukocytes, UA NEGATIVE NEGATIVE  Wet  prep, genital     Status: Abnormal   Collection Time: 04/27/16  1:05 AM  Result Value Ref Range   Yeast Wet Prep HPF POC NONE SEEN NONE SEEN   Trich, Wet Prep NONE SEEN NONE SEEN   Clue Cells Wet Prep HPF POC NONE SEEN NONE SEEN   WBC, Wet Prep HPF POC MODERATE (A) NONE SEEN   Sperm NONE SEEN    No results found.   MDM Unable to doppler FHT likely d/t body habitus GC/CT & wet prep Will retreat for trich d/t exposure Cervix closed Ultrasound shows SIUP with cardiac activity Assessment and Plan  A: 1. Trichomonas exposure   2. Fetal heart tones not heard   3. [redacted] weeks gestation of pregnancy   4. Abdominal pain affecting pregnancy   5. Maternal obesity affecting pregnancy, antepartum    P: Discharge home Expedited partner tx & info sheet given No intercourse x 2 weeks after both have been treated Keep f/u with OB next week  Judeth Hornrin Wyatt Galvan 04/27/2016, 12:51 AM

## 2016-04-27 NOTE — MAU Note (Signed)
Pelvic pain for entire pregnancy. Getting worse. Abd pain hurts all over like being twisted in knots. Abd pain for . Sometimes hurts up into chest. Denies vag bleeding or d/c.

## 2016-04-27 NOTE — MAU Note (Signed)
Patient reports pelvic pain, feels sore and is agitated by any bending at the waist.  States it feels "like a bruise, as if someone is pushing on it." Pain has been going on for the past 2 weeks and has progressively gotten worse over the past two weeks.   2 Episodes of diarrhea today.  No N/V reported.  Patient has a posteriorly tilted uterus. Last BM yesterday.   Taking tylenol for the pain but has not taken any recently because she states she gets drowsy.  Has not seen anyone for the pain.

## 2016-04-27 NOTE — Discharge Instructions (Signed)
Trichomoniasis Trichomoniasis is an infection caused by an organism called Trichomonas. The infection can affect both women and men. In women, the outer female genitalia and the vagina are affected. In men, the penis is mainly affected, but the prostate and other reproductive organs can also be involved. Trichomoniasis is a sexually transmitted infection (STI) and is most often passed to another person through sexual contact.  RISK FACTORS  Having unprotected sexual intercourse.  Having sexual intercourse with an infected partner. SIGNS AND SYMPTOMS  Symptoms of trichomoniasis in women include:  Abnormal gray-green frothy vaginal discharge.  Itching and irritation of the vagina.  Itching and irritation of the area outside the vagina. Symptoms of trichomoniasis in men include:   Penile discharge with or without pain.  Pain during urination. This results from inflammation of the urethra. DIAGNOSIS  Trichomoniasis may be found during a Pap test or physical exam. Your health care provider may use one of the following methods to help diagnose this infection:  Testing the pH of the vagina with a test tape.  Using a vaginal swab test that checks for the Trichomonas organism. A test is available that provides results within a few minutes.  Examining a urine sample.  Testing vaginal secretions. Your health care provider may test you for other STIs, including HIV. TREATMENT   You may be given medicine to fight the infection. Women should inform their health care provider if they could be or are pregnant. Some medicines used to treat the infection should not be taken during pregnancy.  Your health care provider may recommend over-the-counter medicines or creams to decrease itching or irritation.  Your sexual partner will need to be treated if infected.  Your health care provider may test you for infection again 3 months after treatment. HOME CARE INSTRUCTIONS   Take medicines only as  directed by your health care provider.  Take over-the-counter medicine for itching or irritation as directed by your health care provider.  Do not have sexual intercourse while you have the infection.  Women should not douche or wear tampons while they have the infection.  Discuss your infection with your partner. Your partner may have gotten the infection from you, or you may have gotten it from your partner.  Have your sex partner get examined and treated if necessary.  Practice safe, informed, and protected sex.  See your health care provider for other STI testing. SEEK MEDICAL CARE IF:   You still have symptoms after you finish your medicine.  You develop abdominal pain.  You have pain when you urinate.  You have bleeding after sexual intercourse.  You develop a rash.  Your medicine makes you sick or makes you throw up (vomit). MAKE SURE YOU:  Understand these instructions.  Will watch your condition.  Will get help right away if you are not doing well or get worse.   This information is not intended to replace advice given to you by your health care provider. Make sure you discuss any questions you have with your health care provider.   Document Released: 11/27/2000 Document Revised: 06/24/2014 Document Reviewed: 03/15/2013 Elsevier Interactive Patient Education 2016 Elsevier Inc.    Abdominal Pain During Pregnancy Abdominal pain is common in pregnancy. Most of the time, it does not cause harm. There are many causes of abdominal pain. Some causes are more serious than others. Some of the causes of abdominal pain in pregnancy are easily diagnosed. Occasionally, the diagnosis takes time to understand. Other times, the cause is  not determined. Abdominal pain can be a sign that something is very wrong with the pregnancy, or the pain may have nothing to do with the pregnancy at all. For this reason, always tell your health care provider if you have any abdominal  discomfort. HOME CARE INSTRUCTIONS  Monitor your abdominal pain for any changes. The following actions may help to alleviate any discomfort you are experiencing:  Do not have sexual intercourse or put anything in your vagina until your symptoms go away completely.  Get plenty of rest until your pain improves.  Drink clear fluids if you feel nauseous. Avoid solid food as long as you are uncomfortable or nauseous.  Only take over-the-counter or prescription medicine as directed by your health care provider.  Keep all follow-up appointments with your health care provider. SEEK IMMEDIATE MEDICAL CARE IF:  You are bleeding, leaking fluid, or passing tissue from the vagina.  You have increasing pain or cramping.  You have persistent vomiting.  You have painful or bloody urination.  You have a fever.  You notice a decrease in your baby's movements.  You have extreme weakness or feel faint.  You have shortness of breath, with or without abdominal pain.  You develop a severe headache with abdominal pain.  You have abnormal vaginal discharge with abdominal pain.  You have persistent diarrhea.  You have abdominal pain that continues even after rest, or gets worse. MAKE SURE YOU:   Understand these instructions.  Will watch your condition.  Will get help right away if you are not doing well or get worse.   This information is not intended to replace advice given to you by your health care provider. Make sure you discuss any questions you have with your health care provider.   Document Released: 06/03/2005 Document Revised: 03/24/2013 Document Reviewed: 12/31/2012 Elsevier Interactive Patient Education [employer]2016 Elsevier Inc.

## 2016-04-29 LAB — GC/CHLAMYDIA PROBE AMP (~~LOC~~) NOT AT ARMC
CHLAMYDIA, DNA PROBE: NEGATIVE
Neisseria Gonorrhea: NEGATIVE

## 2016-06-28 ENCOUNTER — Inpatient Hospital Stay (HOSPITAL_COMMUNITY): Payer: Medicaid Other

## 2016-06-28 ENCOUNTER — Inpatient Hospital Stay (HOSPITAL_COMMUNITY)
Admission: AD | Admit: 2016-06-28 | Discharge: 2016-06-28 | Disposition: A | Discharge status: Home / Self Care | Payer: Medicaid Other | Source: Ambulatory Visit | Attending: Obstetrics and Gynecology | Admitting: Obstetrics and Gynecology

## 2016-06-28 ENCOUNTER — Encounter (HOSPITAL_COMMUNITY): Payer: Self-pay | Admitting: *Deleted

## 2016-06-28 DIAGNOSIS — Z9889 Other specified postprocedural states: Secondary | ICD-10-CM | POA: Insufficient documentation

## 2016-06-28 DIAGNOSIS — R102 Pelvic and perineal pain: Secondary | ICD-10-CM | POA: Diagnosis not present

## 2016-06-28 DIAGNOSIS — Z9049 Acquired absence of other specified parts of digestive tract: Secondary | ICD-10-CM | POA: Diagnosis not present

## 2016-06-28 DIAGNOSIS — Z3A21 21 weeks gestation of pregnancy: Secondary | ICD-10-CM

## 2016-06-28 DIAGNOSIS — O99612 Diseases of the digestive system complicating pregnancy, second trimester: Secondary | ICD-10-CM | POA: Diagnosis not present

## 2016-06-28 DIAGNOSIS — Z91048 Other nonmedicinal substance allergy status: Secondary | ICD-10-CM | POA: Insufficient documentation

## 2016-06-28 DIAGNOSIS — K580 Irritable bowel syndrome with diarrhea: Secondary | ICD-10-CM | POA: Diagnosis not present

## 2016-06-28 DIAGNOSIS — O26899 Other specified pregnancy related conditions, unspecified trimester: Secondary | ICD-10-CM

## 2016-06-28 DIAGNOSIS — O26892 Other specified pregnancy related conditions, second trimester: Secondary | ICD-10-CM

## 2016-06-28 DIAGNOSIS — N949 Unspecified condition associated with female genital organs and menstrual cycle: Secondary | ICD-10-CM

## 2016-06-28 DIAGNOSIS — Z87891 Personal history of nicotine dependence: Secondary | ICD-10-CM | POA: Diagnosis not present

## 2016-06-28 DIAGNOSIS — R109 Unspecified abdominal pain: Secondary | ICD-10-CM | POA: Diagnosis not present

## 2016-06-28 DIAGNOSIS — R1033 Periumbilical pain: Secondary | ICD-10-CM

## 2016-06-28 HISTORY — DX: Irritable bowel syndrome, unspecified: K58.9

## 2016-06-28 HISTORY — DX: Endometriosis, unspecified: N80.9

## 2016-06-28 LAB — COMPREHENSIVE METABOLIC PANEL
ALT: 8 U/L — ABNORMAL LOW (ref 14–54)
AST: 11 U/L — ABNORMAL LOW (ref 15–41)
Albumin: 3.2 g/dL — ABNORMAL LOW (ref 3.5–5.0)
Alkaline Phosphatase: 44 U/L (ref 38–126)
Anion gap: 6 (ref 5–15)
BUN: 7 mg/dL (ref 6–20)
CHLORIDE: 110 mmol/L (ref 101–111)
CO2: 22 mmol/L (ref 22–32)
Calcium: 8.3 mg/dL — ABNORMAL LOW (ref 8.9–10.3)
Creatinine, Ser: 0.43 mg/dL — ABNORMAL LOW (ref 0.44–1.00)
Glucose, Bld: 92 mg/dL (ref 65–99)
POTASSIUM: 3.7 mmol/L (ref 3.5–5.1)
SODIUM: 138 mmol/L (ref 135–145)
Total Bilirubin: 0.3 mg/dL (ref 0.3–1.2)
Total Protein: 6 g/dL — ABNORMAL LOW (ref 6.5–8.1)

## 2016-06-28 LAB — CBC WITH DIFFERENTIAL/PLATELET
Basophils Absolute: 0 10*3/uL (ref 0.0–0.1)
Basophils Relative: 0 %
EOS ABS: 0.2 10*3/uL (ref 0.0–0.7)
EOS PCT: 2 %
HCT: 32.9 % — ABNORMAL LOW (ref 36.0–46.0)
Hemoglobin: 11.7 g/dL — ABNORMAL LOW (ref 12.0–15.0)
Lymphocytes Relative: 23 %
Lymphs Abs: 1.9 10*3/uL (ref 0.7–4.0)
MCH: 30.8 pg (ref 26.0–34.0)
MCHC: 35.6 g/dL (ref 30.0–36.0)
MCV: 86.6 fL (ref 78.0–100.0)
MONO ABS: 0.3 10*3/uL (ref 0.1–1.0)
Monocytes Relative: 4 %
Neutro Abs: 5.9 10*3/uL (ref 1.7–7.7)
Neutrophils Relative %: 71 %
PLATELETS: 217 10*3/uL (ref 150–400)
RBC: 3.8 MIL/uL — ABNORMAL LOW (ref 3.87–5.11)
RDW: 13.4 % (ref 11.5–15.5)
WBC: 8.4 10*3/uL (ref 4.0–10.5)

## 2016-06-28 LAB — URINALYSIS, ROUTINE W REFLEX MICROSCOPIC
BILIRUBIN URINE: NEGATIVE
GLUCOSE, UA: NEGATIVE mg/dL
HGB URINE DIPSTICK: NEGATIVE
Ketones, ur: NEGATIVE mg/dL
NITRITE: NEGATIVE
PROTEIN: NEGATIVE mg/dL
Specific Gravity, Urine: 1.02 (ref 1.005–1.030)
pH: 6 (ref 5.0–8.0)

## 2016-06-28 LAB — WET PREP, GENITAL
Clue Cells Wet Prep HPF POC: NONE SEEN
SPERM: NONE SEEN
TRICH WET PREP: NONE SEEN
YEAST WET PREP: NONE SEEN

## 2016-06-28 IMAGING — US US MFM OB LIMITED
1 series · 15 of 28 positions shown · non-contrast
Comparison: none

[Series 1: us mfm ob limited · 15 of 30 slices shown]
[im 1/30]
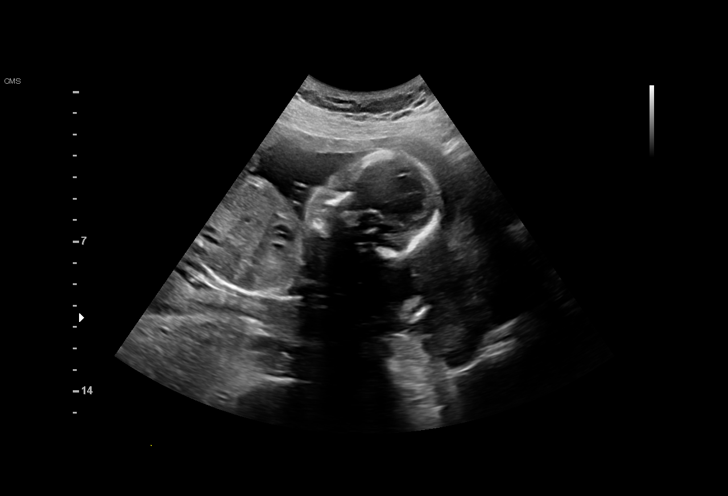
[im 3/30]
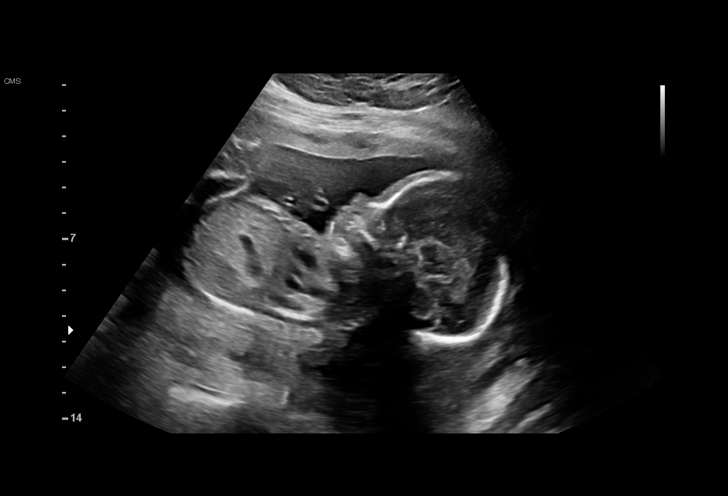
[im 5/30]
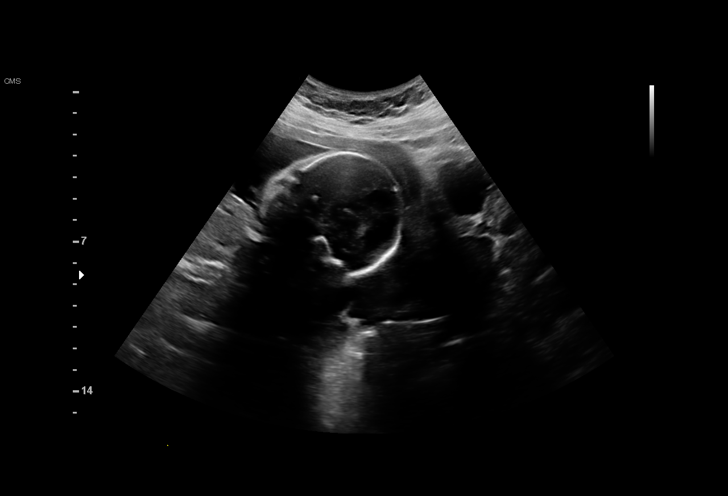
[im 7/30]
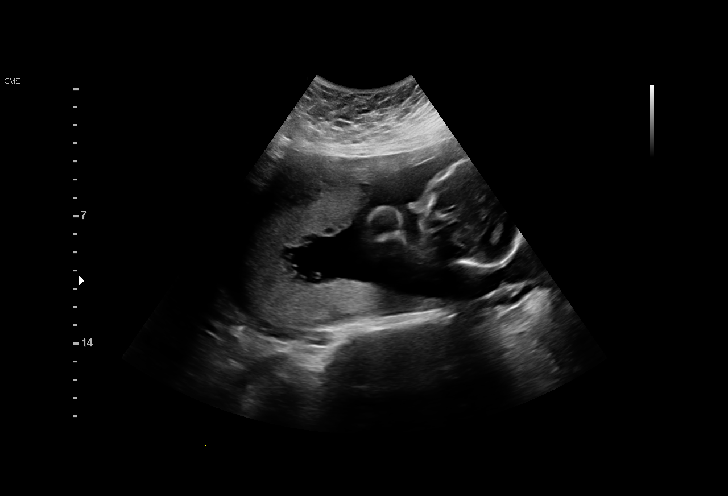
[im 9/30]
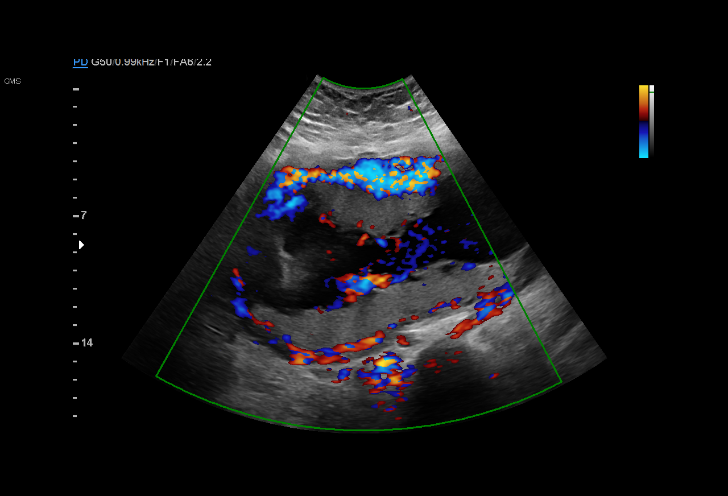
[im 11/30]
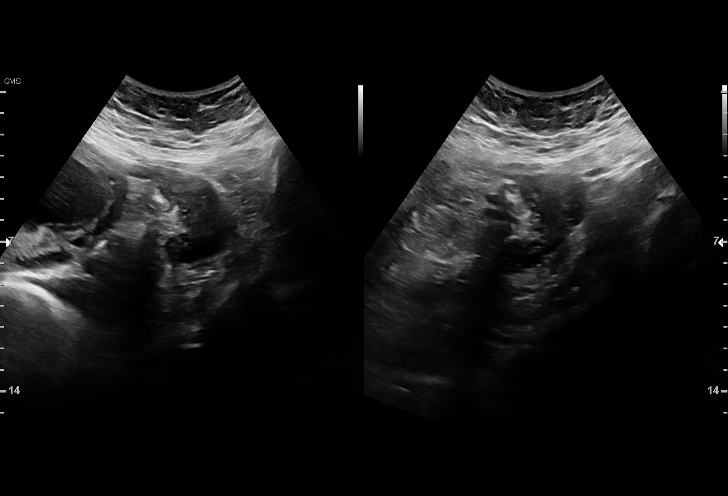
[im 13/30]
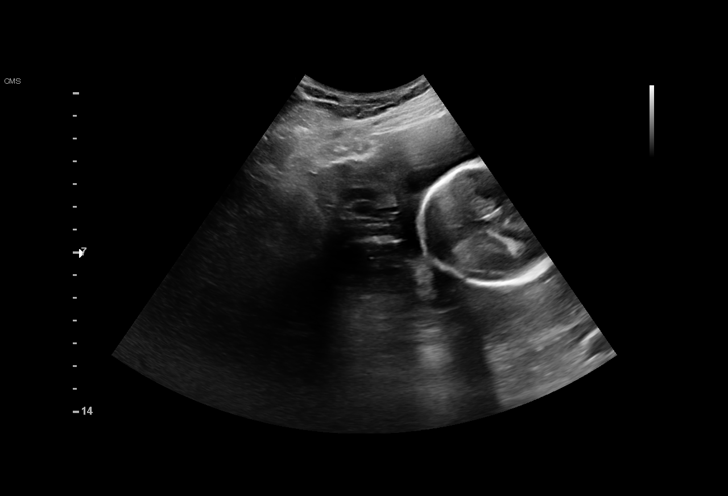
[im 16/30]
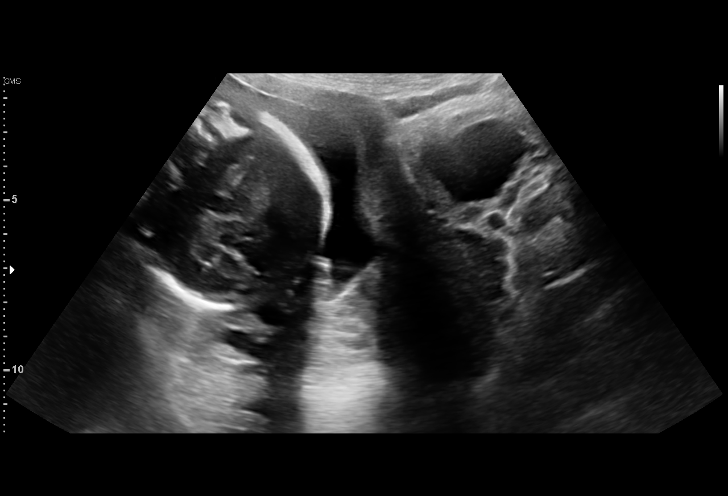
[im 17/30]
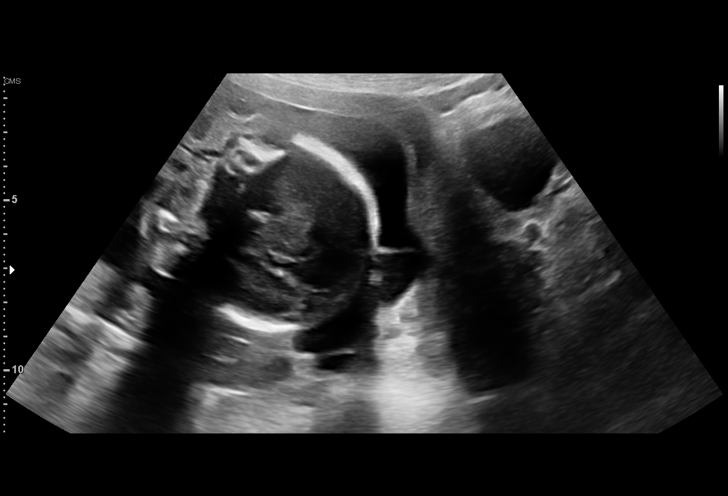
[im 19/30]
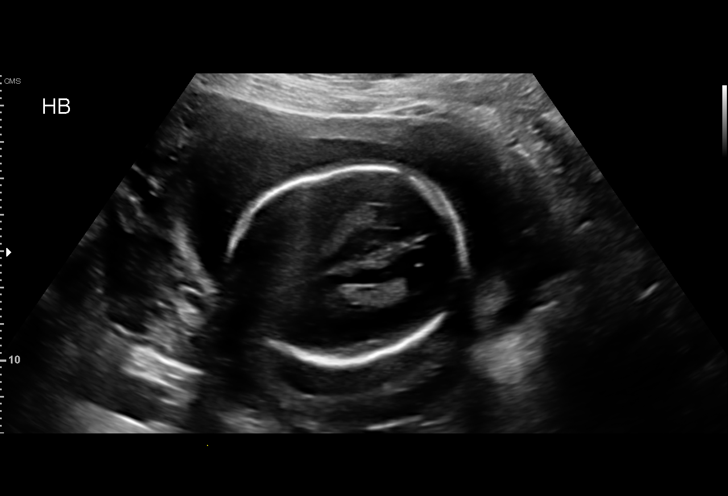
[im 21/30]
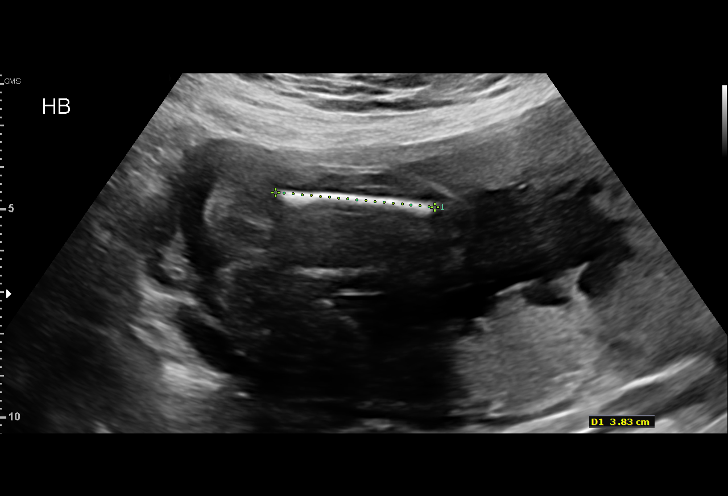
[im 23/30]
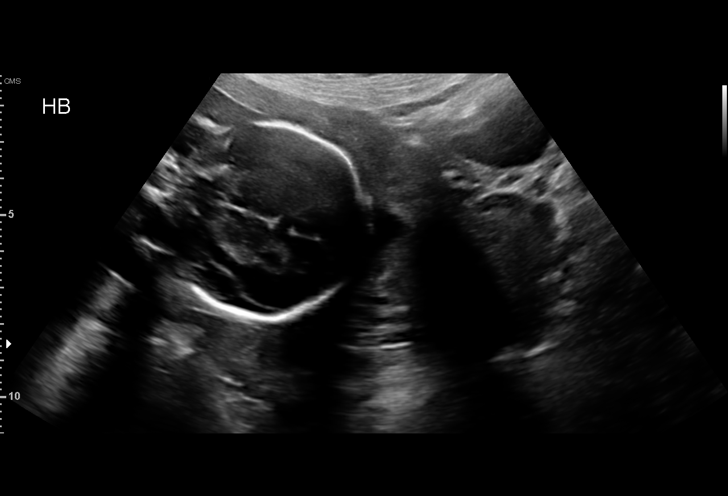
[im 25/30]
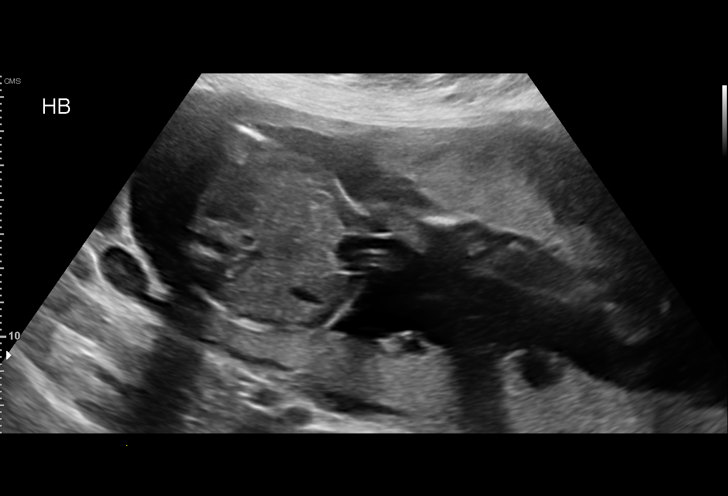
[im 27/30]
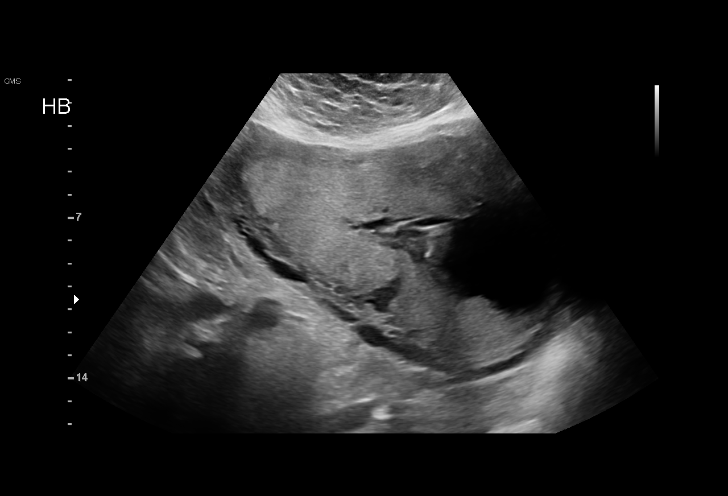
[im 30/30]
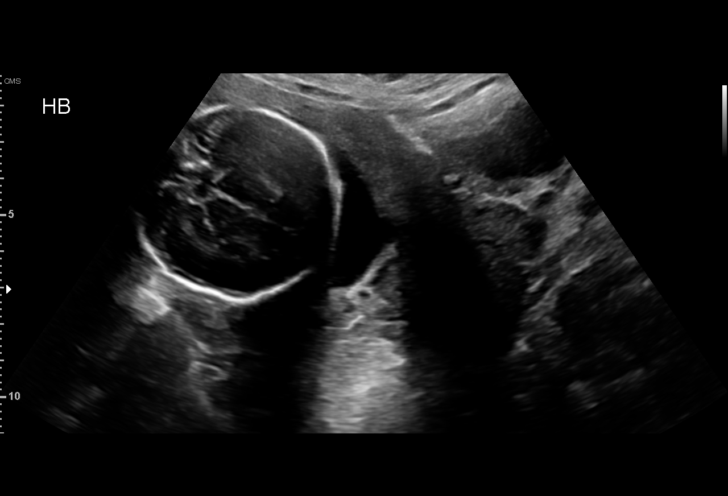

[15 of 28 positions shown; findings below may reference images not displayed]

1  GOTA ABISO          [PHONE_NUMBER]      [PHONE_NUMBER]     [PHONE_NUMBER]
Indications

20 weeks gestation of pregnancy
Abdominal pain in pregnancy                    [0F]
OB History

Gravidity:    3         Term:   1        Prem:   0        SAB:   1
TOP:          0       Ectopic:  0        Living: 1
Fetal Evaluation

Num Of Fetuses:     1
Fetal Heart         143
Rate(bpm):
Cardiac Activity:   Observed
Presentation:       Cephalic
Placenta:           Fundal, above cervical os
P. Cord Insertion:  Visualized, central

Amniotic Fluid
AFI FV:      Subjectively within normal limits

Largest Pocket(cm)
7.15
Gestational Age

LMP:           21w 6d       Date:   [DATE]                 EDD:   [DATE]
Best:          20w 6d    Det. By:   Early Ultrasound         EDD:   [DATE]
([DATE])
Cervix Uterus Adnexa

Cervix
Length:            3.6  cm.
Normal appearance by transabdominal scan.
Uterus
No abnormality visualized.

Left Ovary
Within normal limits.

Right Ovary
Not visualized.

Cul De Sac:   No free fluid seen.

Adnexa:       No abnormality visualized.
Impression

SIUP at 20+6 weeks
Normal amniotic fluid volume
Fundal placenta; no previa; no subchorionic fluid
collections/hemorrhage identified
TA views of cervix: normal length without funneling
Recommendations

Follow-up as clinically indicated

## 2016-06-28 MED ORDER — DICYCLOMINE HCL 10 MG PO CAPS
10.0000 mg | ORAL_CAPSULE | Freq: Once | ORAL | Status: AC
  Administered 2016-06-28: 10 mg via ORAL
  Filled 2016-06-28: qty 1

## 2016-06-28 MED ORDER — OXYCODONE-ACETAMINOPHEN 5-325 MG PO TABS: 1.0000 | ORAL_TABLET | Freq: Three times a day (TID) | ORAL | 0 refills | Status: AC | PRN

## 2016-06-28 MED ORDER — OXYCODONE-ACETAMINOPHEN 5-325 MG PO TABS
1.0000 | ORAL_TABLET | Freq: Once | ORAL | Status: AC
  Administered 2016-06-28: 1 via ORAL
  Filled 2016-06-28: qty 1

## 2016-06-28 NOTE — MAU Note (Signed)
C/o intermittent,sharp,shooting pain on R lower abdomen for past 2 days;

## 2016-06-28 NOTE — MAU Provider Note (Signed)
Chief Complaint:  Pelvic Pain   First Provider Initiated Contact with Patient 06/28/16 1037      HPI: Ana Hudson is a 26 y.o. G3P1011 at [redacted]w[redacted]d who presents to maternity admissions reporting abdominal pain.  Started as right sided, now left-sided. Began 4 days ago. Patient denies pain as contractions. Mid abdomen, worse than typical IBS pain. Pain at worst is 8/10.   Denies fevers/ chills. Had diarrhea yesterday, no BM today, states this is typical for her IBS symptoms (constipation/diarrhea alternating), nothing different or unusual. No bloody stools. Hurts to have BM and urinate.Denies urinary symptoms of burning or frequency or urgency.  Hurts more with walking. Radiates to groin. Denies bleeding. Recent trichomonas infection, 3 weeks ago, treated.    Denies contractions, leakage of fluid or vaginal bleeding. Good fetal movement.   Pregnancy Course:   Past Medical History: Past Medical History:  Diagnosis Date  . Anemia   . Endometriosis   . Heart murmur   . History of febrile seizure    <1 yrs old  . History of trichomoniasis 03/2016  . IBS (irritable bowel syndrome)   . Postpartum hemorrhage   . Seizures (HCC)     Past obstetric history: OB History  Gravida Para Term Preterm AB Living  3 1 1   1 1   SAB TAB Ectopic Multiple Live Births  1     0 1    # Outcome Date GA Lbr Len/2nd Weight Sex Delivery Anes PTL Lv  3 Current           2 Term 07/08/14 [redacted]w[redacted]d / 02:36 9 lb 11.2 oz (4.4 kg) M Vag-Spont EPI  LIV  1 SAB               Past Surgical History: Past Surgical History:  Procedure Laterality Date  . DILATION AND CURETTAGE OF UTERUS N/A 07/29/2014   Procedure: SUCTION DILATATION AND CURETTAGE;  Surgeon: Silverio Lay, MD;  Location: [facility];  Service: Gynecology;  Laterality: N/A;  . TONSILLECTOMY AND ADENOIDECTOMY       Family History: No family history on file.  Social History: Social History  Substance Use Topics  . Smoking status: Former Games developer  .  Smokeless tobacco: Former Neurosurgeon  . Alcohol use Yes     Comment: rare when not pregnant    Allergies:  Allergies  Allergen Reactions  . Nickel Rash    Meds:  Prescriptions Prior to Admission  Medication Sig Dispense Refill Last Dose  . acetaminophen (TYLENOL) 500 MG tablet Take 1,000 mg by mouth every 6 (six) hours as needed for mild pain.   Past Week at Unknown time  . Prenatal Vit-Fe Fumarate-FA (PRENATAL MULTIVITAMIN) TABS tablet Take 1 tablet by mouth daily at 12 noon.   06/27/2016 at Unknown time    I have reviewed patient's Past Medical Hx, Surgical Hx, Family Hx, Social Hx, medications and allergies.   ROS:  A comprehensive ROS was negative except per HPI.    Physical Exam  Patient Vitals for the past 24 hrs:  BP Temp Temp src Pulse Resp Weight  06/28/16 0941 116/59 98.4 F (36.9 C) Oral 94 20 220 lb 4 oz (99.9 kg)   Constitutional: Well-developed, well-nourished female in no acute distress. Tearful when walking in room, states from pain. Cardiovascular: normal rate, rhythm, no murmurs Respiratory: normal effort, CTAB GI: Abd soft, +tenderness over umbilicus area and left sided, gravid appropriate for gestational age. Pos BS x 4. +ventral hernia MS: Extremities nontender,  no edema, normal ROM Neurologic: Alert and oriented x 4.  GU: Neg CVAT. Pelvic: NEFG, physiologic discharge, no blood, cervix clean. No CMT   FHT:  Doppler 135 bpm   Labs: Results for orders placed or performed during the hospital encounter of 06/28/16 (from the past 24 hour(s))  Urinalysis, Routine w reflex microscopic     Status: Abnormal   Collection Time: 06/28/16  9:45 AM  Result Value Ref Range   Color, Urine YELLOW YELLOW   APPearance HAZY (A) CLEAR   Specific Gravity, Urine 1.020 1.005 - 1.030   pH 6.0 5.0 - 8.0   Glucose, UA NEGATIVE NEGATIVE mg/dL   Hgb urine dipstick NEGATIVE NEGATIVE   Bilirubin Urine NEGATIVE NEGATIVE   Ketones, ur NEGATIVE NEGATIVE mg/dL   Protein, ur NEGATIVE  NEGATIVE mg/dL   Nitrite NEGATIVE NEGATIVE   Leukocytes, UA SMALL (A) NEGATIVE   RBC / HPF 0-5 0 - 5 RBC/hpf   WBC, UA 0-5 0 - 5 WBC/hpf   Bacteria, UA RARE (A) NONE SEEN   Squamous Epithelial / LPF 6-30 (A) NONE SEEN   Mucous PRESENT   CBC with Differential/Platelet     Status: Abnormal   Collection Time: 06/28/16 11:08 AM  Result Value Ref Range   WBC 8.4 4.0 - 10.5 K/uL   RBC 3.80 (L) 3.87 - 5.11 MIL/uL   Hemoglobin 11.7 (L) 12.0 - 15.0 g/dL   HCT 40.9 (L) 81.1 - 91.4 %   MCV 86.6 78.0 - 100.0 fL   MCH 30.8 26.0 - 34.0 pg   MCHC 35.6 30.0 - 36.0 g/dL   RDW 78.2 95.6 - 21.3 %   Platelets 217 150 - 400 K/uL   Neutrophils Relative % 71 %   Neutro Abs 5.9 1.7 - 7.7 K/uL   Lymphocytes Relative 23 %   Lymphs Abs 1.9 0.7 - 4.0 K/uL   Monocytes Relative 4 %   Monocytes Absolute 0.3 0.1 - 1.0 K/uL   Eosinophils Relative 2 %   Eosinophils Absolute 0.2 0.0 - 0.7 K/uL   Basophils Relative 0 %   Basophils Absolute 0.0 0.0 - 0.1 K/uL  Comprehensive metabolic panel     Status: Abnormal   Collection Time: 06/28/16 11:08 AM  Result Value Ref Range   Sodium 138 135 - 145 mmol/L   Potassium 3.7 3.5 - 5.1 mmol/L   Chloride 110 101 - 111 mmol/L   CO2 22 22 - 32 mmol/L   Glucose, Bld 92 65 - 99 mg/dL   BUN 7 6 - 20 mg/dL   Creatinine, Ser 0.86 (L) 0.44 - 1.00 mg/dL   Calcium 8.3 (L) 8.9 - 10.3 mg/dL   Total Protein 6.0 (L) 6.5 - 8.1 g/dL   Albumin 3.2 (L) 3.5 - 5.0 g/dL   AST 11 (L) 15 - 41 U/L   ALT 8 (L) 14 - 54 U/L   Alkaline Phosphatase 44 38 - 126 U/L   Total Bilirubin 0.3 0.3 - 1.2 mg/dL   GFR calc non Af Amer >60 >60 mL/min   GFR calc Af Amer >60 >60 mL/min   Anion gap 6 5 - 15  Wet prep, genital     Status: Abnormal   Collection Time: 06/28/16 11:20 AM  Result Value Ref Range   Yeast Wet Prep HPF POC NONE SEEN NONE SEEN   Trich, Wet Prep NONE SEEN NONE SEEN   Clue Cells Wet Prep HPF POC NONE SEEN NONE SEEN   WBC, Wet Prep HPF POC FEW (  A) NONE SEEN   Sperm NONE SEEN      Imaging:  [place] Mfm Ob Limited  Result Date: 06/28/2016 ----------------------------------------------------------------------  OBSTETRICS REPORT                      (Signed Final 06/28/2016 07:41 pm) ---------------------------------------------------------------------- Patient Info  ID #:       [id number]                         D.O.B.:   [birth year] (25 yrs)  Name:       Ana Hudson                  Visit Date:  06/28/2016 11:43 am ---------------------------------------------------------------------- Performed By  Performed By:     Eden Lathe BS      Referred By:      MAU Nursing-                    RDMS RVT                                 MAU/Triage  Attending:        Particia Nearing MD       Location:         [facility] ---------------------------------------------------------------------- Orders   #  Description                                 Code   1  [place] MFM OB LIMITED                           [phone]  ----------------------------------------------------------------------   #  Ordered By               Order #        Accession #    Episode #   1  Jen Mow          [id number]      [id number]     [id number]  ---------------------------------------------------------------------- Indications   [redacted] weeks gestation of pregnancy                Z3A.20   Abdominal pain in pregnancy                    O99.89  ---------------------------------------------------------------------- OB History  Gravidity:    3         Term:   1        Prem:   0        SAB:   1  TOP:          0       Ectopic:  0        Living: 1 ---------------------------------------------------------------------- Fetal Evaluation  Num Of Fetuses:     1  Fetal Heart         143  Rate(bpm):  Cardiac Activity:   Observed  Presentation:       Cephalic  Placenta:           Fundal, above cervical os  P. Cord Insertion:  Visualized, central  Amniotic Fluid  AFI FV:      Subjectively within normal limits  Largest  Pocket(cm)                              7.15 ---------------------------------------------------------------------- Gestational Age  LMP:           21w 6d       Date:   01/27/16                 EDD:   11/02/16  Best:          Cherylann Parr 6d    Det. ByMarcella Dubs         EDD:   11/09/16                                      (03/18/16) ---------------------------------------------------------------------- Cervix Uterus Adnexa  Cervix  Length:            3.6  cm.  Normal appearance by transabdominal scan.  Uterus  No abnormality visualized.  Left Ovary  Within normal limits.  Right Ovary  Not visualized.  Cul De Sac:   No free fluid seen.  Adnexa:       No abnormality visualized. ---------------------------------------------------------------------- Impression  SIUP at 20+6 weeks  Normal amniotic fluid volume  Fundal placenta; no previa; no subchorionic fluid  collections/hemorrhage identified  TA views of cervix: normal length without funneling ---------------------------------------------------------------------- Recommendations  Follow-up as clinically indicated ----------------------------------------------------------------------                 Particia Nearing, MD Electronically Signed Final Report   06/28/2016 07:41 pm ----------------------------------------------------------------------   MAU Course: [place] - normal cardiac activity, normal CL w/o funneling, normal location of placenta without hemorrhage. Wet prep - normal GC/CT - pending CBC/CMP- normal, no signs of infection or liver concerns UA - dirty catch but normal Bentyl trial - no relief Percocet - relief  MDM: Plan of care reviewed with patient, including labs and tests ordered and medical treatment. Ruled out acute signs of preterm labor or SAB, good fetal heart tones/cardiac activity on [place], cervical length normal without funneling. Discussed with patient symptoms likely GI related and should follow up with PCP/GI doctor. Percocet seemed to  relieve pain to tolerable level, discussed with patient will Rx a couple days worth until can follow up with PCP/GI doctor.    Assessment: 1. Round ligament pain   2. [redacted] weeks gestation of pregnancy   3. Abdominal pain affecting pregnancy   4. Periumbilical abdominal pain     Plan: Discharge home in stable condition.  Preterm Labor precautions and fetal kick counts Bleeding precautions given Discussed if pain worsens, to follow up at [facility] ED, otherwise, needs follow up with GI doctor.   Follow-up Information    Your OB Physician. Schedule an appointment as soon as possible for a visit in 1 week(s).   Why:  Follow up pain          Allergies as of 06/28/2016      Reactions   Nickel Rash      Medication List    TAKE these medications   acetaminophen 500 MG tablet Commonly known as:  TYLENOL Take 1,000 mg by mouth every 6 (six) hours as needed for mild pain.   oxyCODONE-acetaminophen 5-325 MG tablet Commonly known as:  ROXICET Take 1 tablet by mouth every 8 (eight) hours as needed for severe pain.  prenatal multivitamin Tabs tablet Take 1 tablet by mouth daily at 12 noon.       Jen MowElizabeth Chameka Mcmullen, DO [facility] 06/28/2016 5:11 PM

## 2016-06-28 NOTE — Discharge Instructions (Signed)
Abdominal Pain, Adult Many things can cause belly (abdominal) pain. Most times, belly pain is not dangerous. Many cases of belly pain can be watched and treated at home. Sometimes belly pain is serious, though. Your doctor will try to find the cause of your belly pain. Follow these instructions at home:  Take over-the-counter and prescription medicines only as told by your doctor. Do not take medicines that help you poop (laxatives) unless told to by your doctor.  Drink enough fluid to keep your pee (urine) clear or pale yellow.  Watch your belly pain for any changes.  Keep all follow-up visits as told by your doctor. This is important. Contact a doctor if:  Your belly pain changes or gets worse.  You are not hungry, or you lose weight without trying.  You are having trouble pooping (constipated) or have watery poop (diarrhea) for more than 2-3 days.  You have pain when you pee or poop.  Your belly pain wakes you up at night.  Your pain gets worse with meals, after eating, or with certain foods.  You are throwing up and cannot keep anything down.  You have a fever. Get help right away if:  Your pain does not go away as soon as your doctor says it should.  You cannot stop throwing up.  Your pain is only in areas of your belly, such as the right side or the left lower part of the belly.  You have bloody or black poop, or poop that looks like tar.  You have very bad pain, cramping, or bloating in your belly.  You have signs of not having enough fluid or water in your body (dehydration), such as:  Dark pee, very little pee, or no pee.  Cracked lips.  Dry mouth.  Sunken eyes.  Sleepiness.  Weakness. This information is not intended to replace advice given to you by your health care provider. Make sure you discuss any questions you have with your health care provider. Document Released: 11/20/2007 Document Revised: 12/22/2015 Document Reviewed: 11/15/2015 Elsevier  Interactive Patient Education  2017 Elsevier Inc. Round Ligament Pain Introduction The round ligament is a cord of muscle and tissue that helps to support the uterus. It can become a source of pain during pregnancy if it becomes stretched or twisted as the baby grows. The pain usually begins in the second trimester of pregnancy, and it can come and go until the baby is delivered. It is not a serious problem, and it does not cause harm to the baby. Round ligament pain is usually a short, sharp, and pinching pain, but it can also be a dull, lingering, and aching pain. The pain is felt in the lower side of the abdomen or in the groin. It usually starts deep in the groin and moves up to the outside of the hip area. Pain can occur with:  A sudden change in position.  Rolling over in bed.  Coughing or sneezing.  Physical activity. Follow these instructions at home: Watch your condition for any changes. Take these steps to help with your pain:  When the pain starts, relax. Then try:  Sitting down.  Flexing your knees up to your abdomen.  Lying on your side with one pillow under your abdomen and another pillow between your legs.  Sitting in a warm bath for 15-20 minutes or until the pain goes away.  Take over-the-counter and prescription medicines only as told by your health care provider.  Move slowly when you  sit and stand.  Avoid long walks if they cause pain.  Stop or lessen your physical activities if they cause pain. Contact a health care provider if:  Your pain does not go away with treatment.  You feel pain in your back that you did not have before.  Your medicine is not helping. Get help right away if:  You develop a fever or chills.  You develop uterine contractions.  You develop vaginal bleeding.  You develop nausea or vomiting.  You develop diarrhea.  You have pain when you urinate. This information is not intended to replace advice given to you by your  health care provider. Make sure you discuss any questions you have with your health care provider. Document Released: 03/12/2008 Document Revised: 11/09/2015 Document Reviewed: 08/10/2014  2017 Elsevier

## 2016-06-28 NOTE — MAU Note (Signed)
having a lot of lower pelvic pain.  Initially was radiating to rt lower back, now more on left side.  Started as sharp shooting pain when using restroom

## 2016-07-01 ENCOUNTER — Emergency Department (HOSPITAL_COMMUNITY)
Admission: EM | Admit: 2016-07-01 | Discharge: 2016-07-01 | Disposition: A | Discharge status: Home / Self Care | Payer: Medicaid Other | Attending: Emergency Medicine | Admitting: Emergency Medicine

## 2016-07-01 ENCOUNTER — Encounter (HOSPITAL_COMMUNITY): Payer: Self-pay | Admitting: Emergency Medicine

## 2016-07-01 DIAGNOSIS — Z87891 Personal history of nicotine dependence: Secondary | ICD-10-CM | POA: Diagnosis not present

## 2016-07-01 DIAGNOSIS — Z3490 Encounter for supervision of normal pregnancy, unspecified, unspecified trimester: Secondary | ICD-10-CM

## 2016-07-01 DIAGNOSIS — Z3A22 22 weeks gestation of pregnancy: Secondary | ICD-10-CM | POA: Diagnosis not present

## 2016-07-01 DIAGNOSIS — O26892 Other specified pregnancy related conditions, second trimester: Secondary | ICD-10-CM | POA: Diagnosis not present

## 2016-07-01 DIAGNOSIS — M7918 Myalgia, other site: Secondary | ICD-10-CM

## 2016-07-01 DIAGNOSIS — R109 Unspecified abdominal pain: Secondary | ICD-10-CM | POA: Diagnosis not present

## 2016-07-01 LAB — URINALYSIS, ROUTINE W REFLEX MICROSCOPIC
BILIRUBIN URINE: NEGATIVE
GLUCOSE, UA: NEGATIVE mg/dL
Hgb urine dipstick: NEGATIVE
KETONES UR: NEGATIVE mg/dL
NITRITE: NEGATIVE
PH: 6 (ref 5.0–8.0)
Protein, ur: 30 mg/dL — AB
Specific Gravity, Urine: 1.021 (ref 1.005–1.030)

## 2016-07-01 LAB — GC/CHLAMYDIA PROBE AMP (~~LOC~~) NOT AT ARMC
Chlamydia: NEGATIVE
Neisseria Gonorrhea: NEGATIVE

## 2016-07-01 NOTE — ED Notes (Signed)
Pt reports no change in condition since she was seen this past Fri.

## 2016-07-01 NOTE — ED Notes (Signed)
Pt now has c/o blurred vision bilaterally. Pt denies h/a, nausea, dizziness or neuro changes. States, "I just don't feel good". Dr. Karma GanjaLinker informed.

## 2016-07-01 NOTE — ED Triage Notes (Signed)
Pt from home with c/o left side pain radiating to left abdomen and groin area.  Pt is [redacted] weeks pregnant and [facility]  Pt was told pain is not related to her pregnancy and was referred here for a CT scan of abdomen.  Pt reports pain gets goes from a 8/10 to a 4/10 with [facility]  NAD, A&O.

## 2016-07-01 NOTE — ED Provider Notes (Signed)
[facility]PT Provider Note   CSN: [id number] Arrival date & time: 07/01/16  4098     History   Chief Complaint Chief Complaint  Patient presents with  . Abdominal Pain  . [redacted] Weeks Pregnant    HPI Ana Hudson is a 26 y.o. female.  HPI  Pt presenting with c/o left sided flank/pain at top of iliac crest on left side.  Pt states pain is the same as when she was seen at [facility] hospital 3 days ago.  She has no abdominal or pelvic pain.  She states she does have some burning with urination.  No fever/chills.  No vaginal bleeding.  Ultrasound at [facility] showed IUP at 21-22 weeks.  Pt has been taking percocet for pain as prescribed by [facility].  She has not yet followed up with PMD. There are no other associated systemic symptoms, there are no other alleviating or modifying factors.   Past Medical History:  Diagnosis Date  . Anemia   . Endometriosis   . Heart murmur   . History of febrile seizure    <2 yrs old  . History of trichomoniasis 03/2016  . IBS (irritable bowel syndrome)   . Postpartum hemorrhage   . Seizures [facility])     Patient Active Problem List   Diagnosis Date Noted  . Severe obesity (BMI >= 40) (HCC) 07/07/2014  . IBS (irritable bowel syndrome) - constipation and diarrhea 07/07/2014  . 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 Simple febrile seizure - at age 16 months, then again at 26 years of age 45/21/2016    Past Surgical History:  Procedure Laterality Date  . DILATION AND CURETTAGE OF UTERUS N/A 07/29/2014   Procedure: SUCTION DILATATION AND CURETTAGE;  Surgeon: Silverio Lay, MD;  Location: [facility];  Service: Gynecology;  Laterality: N/A;  . TONSILLECTOMY AND ADENOIDECTOMY      OB History    Gravida Para Term Preterm AB Living   3 1 1   1 1    SAB TAB Ectopic Multiple Live Births   1     0 1       Home Medications    Prior to Admission medications   Medication Sig Start Date End Date Taking? Authorizing Provider    acetaminophen (TYLENOL) 500 MG tablet Take 1,000 mg by mouth every 6 (six) hours as needed for mild pain.   Yes Historical Provider, MD  oxyCODONE-acetaminophen (ROXICET) 5-325 MG tablet Take 1 tablet by mouth every 8 (eight) hours as needed for severe pain. 06/28/16  Yes Hiram Comber Mumaw, DO  Prenatal Vit-Fe Fumarate-FA (PRENATAL MULTIVITAMIN) TABS tablet Take 1 tablet by mouth daily at 12 noon.   Yes Historical Provider, MD    Family History History reviewed. No pertinent family history.  Social History Social History  Substance Use Topics  . Smoking status: Former Games developer  . Smokeless tobacco: Former Neurosurgeon  . Alcohol use Yes     Comment: rare when not pregnant     Allergies   Nickel   Review of Systems Review of Systems  ROS reviewed and all otherwise negative except for mentioned in HPI   Physical Exam Updated Vital Signs BP 119/56   Pulse 70   Temp 98.3 F (36.8 C) (Oral)   Resp 18   Ht 5\' 8"  (1.727 m)   Wt 99.8 kg Comment: patient states she is pregnant  LMP 01/27/2016   SpO2 96%   BMI 33.45 kg/m  Vitals reviewed Physical Exam Physical  Examination: General appearance - alert, well appearing, and in no distress Mental status - alert, oriented to person, place, and time Eyes -no conjunctival injection, no scleral icterus Chest - clear to auscultation, no wheezes, rales or rhonchi, symmetric air entry Heart - normal rate, regular rhythm, normal S1, S2, no murmurs, rubs, clicks or gallops Abdomen - soft, nontender, nondistended, no masses or organomegaly Back exam -no midline tenderness to palpation, no CVA tenderness, mild ttp over lateral lumbar and superior to iliac crest Neurological - alert, oriented x 3, normal speech, moving all extremities Musculoskeletal - no joint tenderness, deformity or swelling, FROM of hip without pain Extremities - peripheral pulses normal, no pedal edema, no clubbing or cyanosis Skin - normal coloration and turgor, no  rashes  ED Treatments / Results  Labs (all labs ordered are listed, but only abnormal results are displayed) Labs Reviewed  URINALYSIS, ROUTINE W REFLEX MICROSCOPIC - Abnormal; Notable for the following:       Result Value   Color, Urine AMBER (*)    APPearance HAZY (*)    Protein, ur 30 (*)    Leukocytes, UA SMALL (*)    Bacteria, UA MANY (*)    Squamous Epithelial / LPF 6-30 (*)    All other components within normal limits  URINE CULTURE    EKG  EKG Interpretation None       Radiology No results found.  Procedures Procedures (including critical care time)  Medications Ordered in ED Medications - No data to display   Initial Impression / Assessment and Plan / ED Course  I have reviewed the triage vital signs and the nursing notes.  Pertinent labs & imaging results that were available during my care of the patient were reviewed by me and considered in my medical decision making (see chart for details).  Clinical Course     Pt presenting with c/o pain in left flank/mid axillary region- pt points to area just superior to left iliac crest.  No midline back tenderness, no CVA tenderness.  No pain with ROM of left hip.  No abdominal or pelvic tenderness on my exam.  D/w patient that urine was reassuring, fetal heart tones were 122.  Suspect muscular pain- no acute abdominal or pelvic symptoms to warrant futher testing or imaging.  Discharged with strict return precautions.  Pt agreeable with plan.  Final Clinical Impressions(s) / ED Diagnoses   Final diagnoses:  Pregnancy, unspecified gestational age  Musculoskeletal pain    New Prescriptions Discharge Medication List as of 07/01/2016  2:53 PM       Jerelyn ScottMartha Linker, MD 07/02/16 1525

## 2016-07-01 NOTE — ED Notes (Signed)
Rapid OB states MD requests only fetal heart tones prior to pt d/c. Dr. Karma GanjaLinker notified.

## 2016-07-01 NOTE — ED Notes (Signed)
Pt is in stable condition upon d/c and ambulates from ED. 

## 2016-07-01 NOTE — Progress Notes (Signed)
1030  Per ED Charge RN, pt sts she was sent over by her OB MD for a CT abdomen.  Decision made that pt has then been OB cleared.  1251  ED RN called requesting [facility] RN see pt.  Per ED RN pt was not truthful about OB MD in [place] sending her to ED.  She was seen by OB and diagnosed with round ligament pain. It was her decision to come to ED.  Call placed to Dr. Jancy Sprankle FullingHarraway-Smith for direction.  Per her direction, pt was cleared by her own OB and is previable so FHT doppler is all that is required.  [facility] RN does not need to evaluate patient in ED.

## 2016-07-01 NOTE — ED Notes (Signed)
OB rapid response notified of pt.  Stated she was already in the hospital and will check in on pt before she leaves.

## 2016-07-01 NOTE — Discharge Instructions (Signed)
Return to the ED with any concerns including abdominal pain, vomiting and not able to keep down liquids, difficulty breathing, weakness of legs, not able to urinate, loss of control of bowel or bladder, or any other alarming symptoms  You should use heating pad, stretching exercises, tylenol and/or percocet for pain, please be sure to arrange for a followup appointment with your primary care doctor

## 2016-07-02 LAB — URINE CULTURE

## 2017-01-29 ENCOUNTER — Emergency Department (HOSPITAL_COMMUNITY)
Admission: EM | Admit: 2017-01-29 | Discharge: 2017-01-29 | Disposition: A | Discharge status: Home / Self Care | Payer: Medicaid Other | Attending: Emergency Medicine | Admitting: Emergency Medicine

## 2017-01-29 ENCOUNTER — Encounter (HOSPITAL_COMMUNITY): Payer: Self-pay | Admitting: Emergency Medicine

## 2017-01-29 DIAGNOSIS — Z87891 Personal history of nicotine dependence: Secondary | ICD-10-CM | POA: Insufficient documentation

## 2017-01-29 DIAGNOSIS — Y929 Unspecified place or not applicable: Secondary | ICD-10-CM | POA: Insufficient documentation

## 2017-01-29 DIAGNOSIS — T23151A Burn of first degree of right palm, initial encounter: Secondary | ICD-10-CM | POA: Insufficient documentation

## 2017-01-29 DIAGNOSIS — Y999 Unspecified external cause status: Secondary | ICD-10-CM | POA: Insufficient documentation

## 2017-01-29 DIAGNOSIS — S6981XA Other specified injuries of right wrist, hand and finger(s), initial encounter: Secondary | ICD-10-CM | POA: Diagnosis present

## 2017-01-29 DIAGNOSIS — T310 Burns involving less than 10% of body surface: Secondary | ICD-10-CM | POA: Insufficient documentation

## 2017-01-29 DIAGNOSIS — Y93G3 Activity, cooking and baking: Secondary | ICD-10-CM | POA: Insufficient documentation

## 2017-01-29 DIAGNOSIS — Y278XXA Contact with other hot objects, undetermined intent, initial encounter: Secondary | ICD-10-CM | POA: Diagnosis not present

## 2017-01-29 DIAGNOSIS — Y939 Activity, unspecified: Secondary | ICD-10-CM | POA: Insufficient documentation

## 2017-01-29 MED ORDER — SILVER SULFADIAZINE 1 % EX CREA
TOPICAL_CREAM | Freq: Once | CUTANEOUS | Status: AC
  Administered 2017-01-29: 1 via TOPICAL
  Filled 2017-01-29: qty 85
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
## 2017-01-29 NOTE — Discharge Instructions (Signed)
U been given a tube of Silvadene ointment.  Please apply to burn daily.  Keep covered for the next several days.  Continue taking your ibuprofen as previously prescribed
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
## 2017-01-30 ENCOUNTER — Encounter (HOSPITAL_COMMUNITY): Payer: Self-pay

## 2018-01-12 DIAGNOSIS — E669 Obesity, unspecified: Secondary | ICD-10-CM | POA: Insufficient documentation

## 2018-02-12 DIAGNOSIS — R87612 Low grade squamous intraepithelial lesion on cytologic smear of cervix (LGSIL): Secondary | ICD-10-CM | POA: Insufficient documentation

## 2018-06-29 DIAGNOSIS — N946 Dysmenorrhea, unspecified: Secondary | ICD-10-CM | POA: Insufficient documentation

## 2018-06-29 DIAGNOSIS — R102 Pelvic and perineal pain: Principal | ICD-10-CM

## 2018-06-29 DIAGNOSIS — G8929 Other chronic pain: Secondary | ICD-10-CM | POA: Insufficient documentation

## 2018-07-28 DIAGNOSIS — F603 Borderline personality disorder: Secondary | ICD-10-CM | POA: Insufficient documentation

## 2018-07-28 DIAGNOSIS — F17210 Nicotine dependence, cigarettes, uncomplicated: Secondary | ICD-10-CM | POA: Insufficient documentation

## 2018-07-28 DIAGNOSIS — F1220 Cannabis dependence, uncomplicated: Secondary | ICD-10-CM | POA: Insufficient documentation

## 2018-07-28 DIAGNOSIS — F1411 Cocaine abuse, in remission: Secondary | ICD-10-CM | POA: Insufficient documentation

## 2018-08-04 ENCOUNTER — Ambulatory Visit: Payer: Self-pay | Admitting: Obstetrics

## 2018-08-20 ENCOUNTER — Encounter: Payer: Self-pay | Admitting: Obstetrics & Gynecology

## 2018-08-20 ENCOUNTER — Ambulatory Visit: Payer: Medicaid Other | Admitting: Obstetrics & Gynecology

## 2018-08-20 VITALS — BP 107/74 | HR 53 | Ht 68.0 in | Wt 217.8 lb

## 2018-08-20 DIAGNOSIS — R102 Pelvic and perineal pain: Secondary | ICD-10-CM | POA: Diagnosis not present

## 2018-08-20 DIAGNOSIS — N731 Chronic parametritis and pelvic cellulitis: Secondary | ICD-10-CM

## 2018-08-20 DIAGNOSIS — G8929 Other chronic pain: Secondary | ICD-10-CM | POA: Diagnosis not present

## 2018-08-20 MED ORDER — NAPROXEN 500 MG PO TABS: 500.0000 mg | ORAL_TABLET | Freq: Two times a day (BID) | ORAL | 2 refills | Status: AC

## 2018-08-20 MED ORDER — GABAPENTIN 300 MG PO CAPS: ORAL_CAPSULE | ORAL | 3 refills | Status: AC

## 2018-08-20 NOTE — Progress Notes (Signed)
GYNECOLOGY OFFICE VISIT NOTE   History:  Ana Hudson is a 28 y.o. [phone] here today to establish gynecologic care and for further management of chronic PID and pelvic pain.  She has been followed by Dr. Brynda Greathouse at [facility], who did extensive evaluation and management of her issues.  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 No endometriosis lesions were discovered. The pelvic peritoneum was friable and hyperemic consistent with chronic inflammation. Normal retroverted uterus and adnexa. She has tried multiple regimens of NSAIDs and narcotics to no avail. Has not tried neuromodulator or tricyclic therapy, and no pelvic floor therapy. Patient wants a hysterectomy.  Denies any abnormal vaginal discharge, bleeding,  or other concerns.    Past Medical History:  Diagnosis Date  . Anemia   . Endometriosis   . Heart murmur   . History of febrile seizure    <1 yrs old  . History of trichomoniasis 03/2016  . IBS (irritable bowel syndrome)   . Postpartum hemorrhage   . Seizures (HCC)     Past Surgical History:  Procedure Laterality Date  . DILATION AND CURETTAGE OF UTERUS N/A 07/29/2014   Procedure: SUCTION DILATATION AND CURETTAGE;  Surgeon: Silverio Lay, MD;  Location: [facility];  Service: Gynecology;  Laterality: N/A;  . TONSILLECTOMY AND ADENOIDECTOMY      The following portions of the patient's history were reviewed and updated as appropriate: allergies, current medications, past family history, past medical history, past social history, past surgical history and problem list.   Health Maintenance:  LGSIL pap but negative colposcopy in 02/2018 at [facility]  Review of Systems:  Pertinent items noted in HPI and remainder of comprehensive ROS otherwise negative.  Physical Exam:  BP 107/74   Pulse (!) 53   Ht 5\' 8"  (1.727 m)   Wt 217 lb 12.8 oz (98.8 kg)   BMI 33.12 kg/m  CONSTITUTIONAL: Well-developed, well-nourished female in no  acute distress.  HEENT:  Normocephalic, atraumatic. External right and left ear normal. No scleral icterus.  NECK: Normal range of motion, supple, no masses noted on observation SKIN: No rash noted. Not diaphoretic. No erythema. No pallor. MUSCULOSKELETAL: Normal range of motion. No edema noted. NEUROLOGIC: Alert and oriented to person, place, and time. Normal muscle tone coordination. No cranial nerve deficit noted. PSYCHIATRIC: Normal mood and affect. Normal behavior. Normal judgment and thought content. CARDIOVASCULAR: Normal heart rate noted RESPIRATORY: Effort and breath sounds normal, no problems with respiration noted ABDOMEN: No masses noted. No other overt distention noted.   PELVIC: Deferred       Assessment and Plan:     1. Chronic pelvic pain in female 2. Chronic PID (chronic pelvic inflammatory disease) Discussed operative findings, she also had recent CT scan in 02/2018 that showed no structural anomalies.  Advised her that hysterectomy will not solve her pain; pain is caused by inflammation of her pelvic peritoneum. Advised to take different NSAID at prescription strength and trial of neuromodulator medication like Neurontin.  If pain continues, may need trial of Amitriptyline/Cymbalta +/- other therapy (pelvic pain specialist). Given findings of normal uterus and adnexa, hysterectomy will likely not help with her pain and should not be considered definitive therapy for it. - naproxen (NAPROSYN) 500 MG tablet; Take 1 tablet (500 mg total) by mouth 2 (two) times daily with a meal. As needed for pain  Dispense: 60 tablet; Refill: 2 - gabapentin (NEURONTIN) 300 MG capsule; Take one capsule by mouth in morning and  two capsules at bedtime  Dispense: 90 capsule; Refill: 3  Return in about 1 month (around 09/20/2018) for Follow up pelvic pain/chronic pain.    Total face-to-face time with patient: 30 minutes.  Over 50% of encounter was spent on counseling and coordination of  care.   Jaynie Collins, MD, FACOG Obstetrician & Gynecologist, [facility] for [employer], [facility]

## 2018-08-20 NOTE — Patient Instructions (Signed)
Return to clinic for any scheduled appointments or for any gynecologic concerns as needed.   

## 2018-09-17 ENCOUNTER — Ambulatory Visit: Payer: Medicaid Other | Admitting: Obstetrics and Gynecology

## 2019-06-06 ENCOUNTER — Emergency Department
Admission: EM | Admit: 2019-06-06 | Discharge: 2019-06-07 | Disposition: A | Discharge status: Home / Self Care | Payer: Medicaid Other | Attending: Emergency Medicine | Admitting: Emergency Medicine

## 2019-06-06 ENCOUNTER — Other Ambulatory Visit: Payer: Self-pay

## 2019-06-06 ENCOUNTER — Emergency Department: Payer: Medicaid Other

## 2019-06-06 DIAGNOSIS — F17210 Nicotine dependence, cigarettes, uncomplicated: Secondary | ICD-10-CM | POA: Diagnosis not present

## 2019-06-06 DIAGNOSIS — M546 Pain in thoracic spine: Secondary | ICD-10-CM | POA: Insufficient documentation

## 2019-06-06 DIAGNOSIS — R202 Paresthesia of skin: Secondary | ICD-10-CM | POA: Insufficient documentation

## 2019-06-06 DIAGNOSIS — Z79899 Other long term (current) drug therapy: Secondary | ICD-10-CM | POA: Diagnosis not present

## 2019-06-06 IMAGING — CR DG CHEST 2V
2 series · 2 of 2 positions shown · non-contrast
Comparison: [DATE]

CLINICAL DATA: Chest pain

EXAM:
CHEST - 2 VIEW

[chest pa]
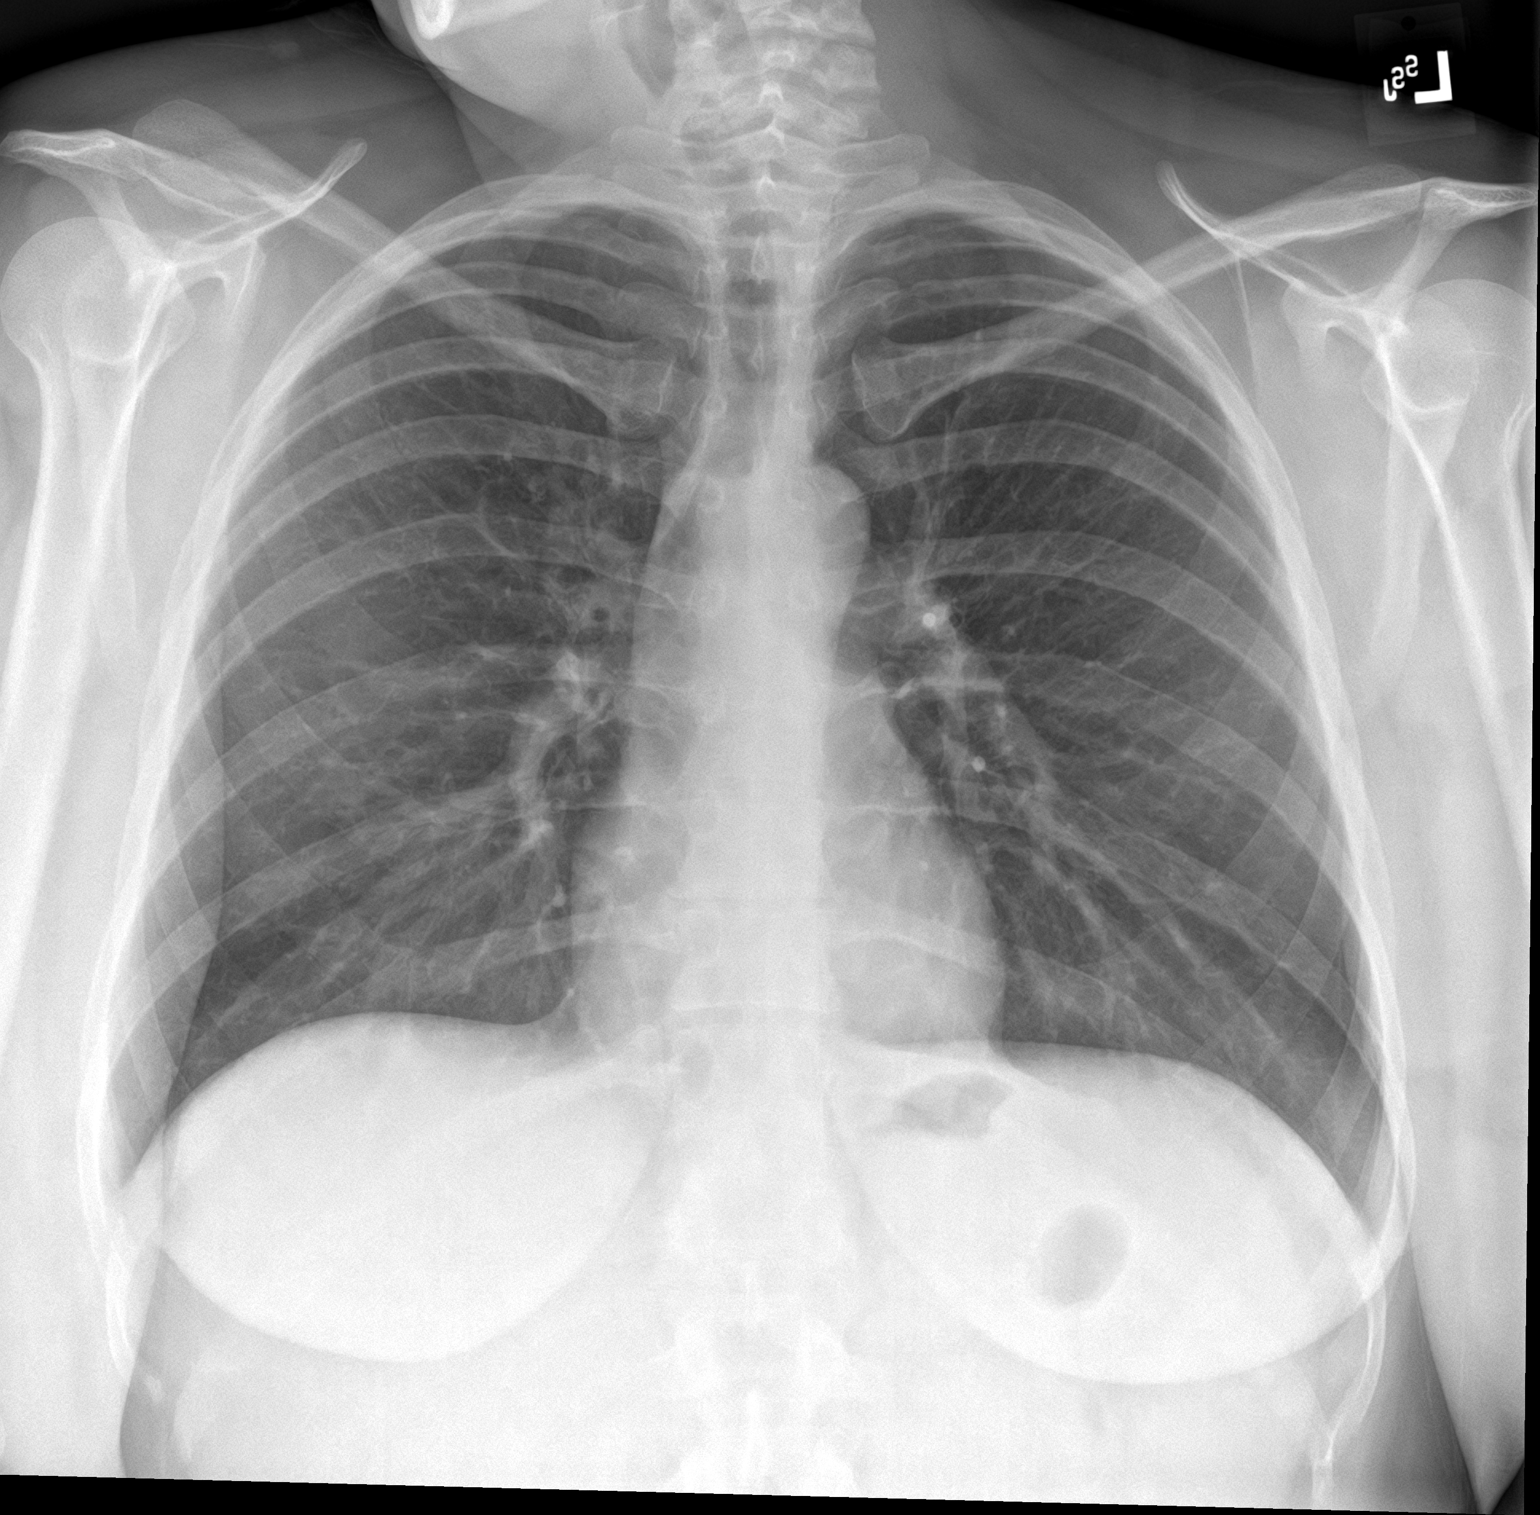

[chest lat]
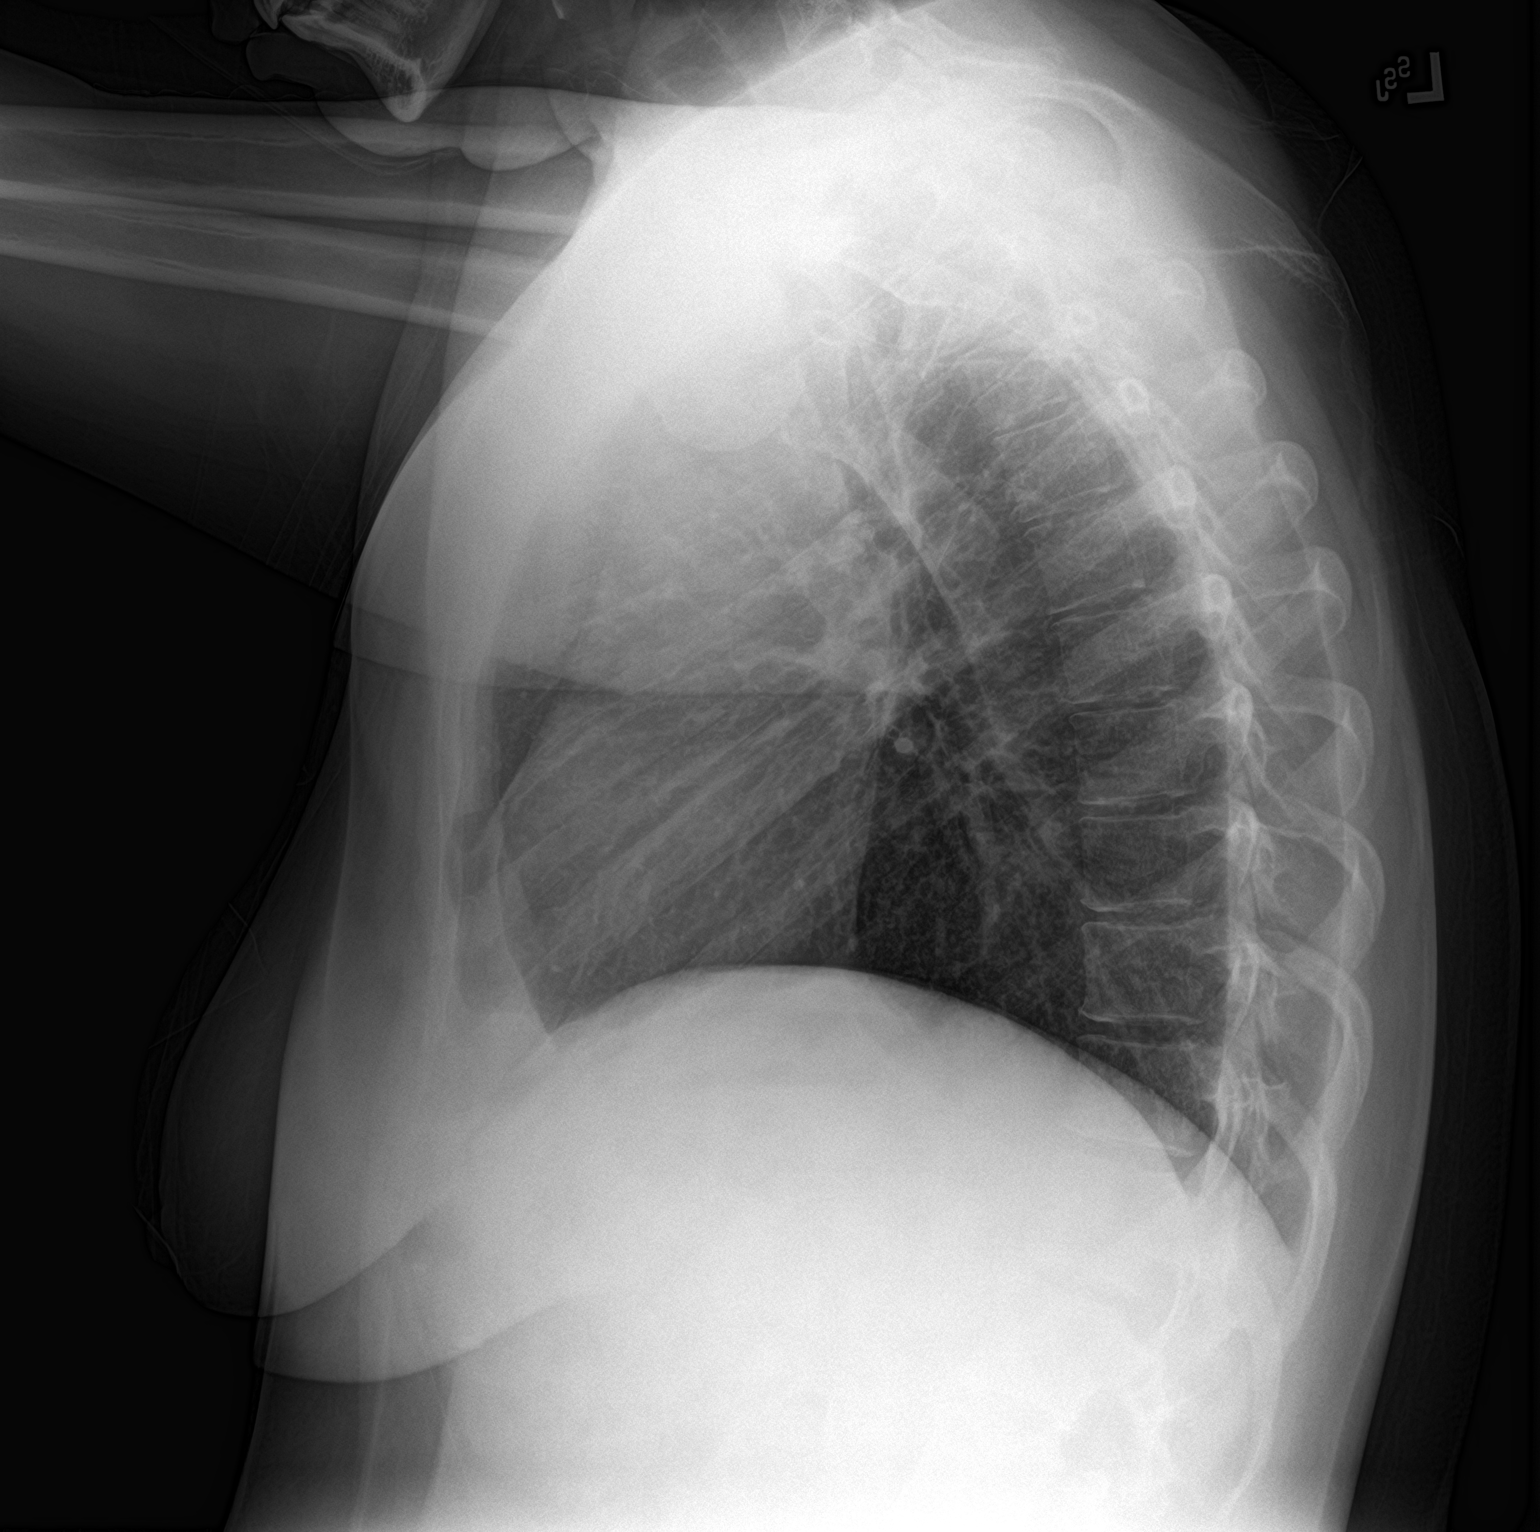

[2 of 2 positions shown; findings below may reference images not displayed]

FINDINGS: The heart size and mediastinal contours are within normal limits.
Both lungs are clear. The visualized skeletal structures are
unremarkable.
IMPRESSION: No active cardiopulmonary disease.

## 2019-06-06 MED ORDER — SODIUM CHLORIDE 0.9% FLUSH: 3.0000 mL | Freq: Once | INTRAVENOUS | Status: DC

## 2019-06-06 NOTE — ED Triage Notes (Signed)
Patient c/o upper back pain, left arm numbness/tingling, left-sided numbness, left chest tightness ongoing 12/9.

## 2019-06-07 ENCOUNTER — Emergency Department: Payer: Medicaid Other

## 2019-06-07 LAB — BASIC METABOLIC PANEL
Anion gap: 5 (ref 5–15)
BUN: 10 mg/dL (ref 6–20)
CO2: 26 mmol/L (ref 22–32)
Calcium: 9.2 mg/dL (ref 8.9–10.3)
Chloride: 109 mmol/L (ref 98–111)
Creatinine, Ser: 0.89 mg/dL (ref 0.44–1.00)
GFR calc Af Amer: >60 mL/min (ref >60)
GFR calc non Af Amer: >60 mL/min (ref >60)
Glucose, Bld: 99 mg/dL (ref 70–99)
Potassium: 3.6 mmol/L (ref 3.5–5.1)
Sodium: 140 mmol/L (ref 135–145)

## 2019-06-07 LAB — CBC
HCT: 44.2 % (ref 36.0–46.0)
Hemoglobin: 15.1 g/dL — ABNORMAL HIGH (ref 12.0–15.0)
MCH: 30.1 pg (ref 26.0–34.0)
MCHC: 34.2 g/dL (ref 30.0–36.0)
MCV: 88 fL (ref 80.0–100.0)
Platelets: 281 10*3/uL (ref 150–400)
RBC: 5.02 MIL/uL (ref 3.87–5.11)
RDW: 12.3 % (ref 11.5–15.5)
WBC: 11.6 10*3/uL — ABNORMAL HIGH (ref 4.0–10.5)
nRBC: 0 % (ref 0.0–0.2)

## 2019-06-07 LAB — POCT PREGNANCY, URINE: Preg Test, Ur: NEGATIVE

## 2019-06-07 LAB — TROPONIN I (HIGH SENSITIVITY): Troponin I (High Sensitivity): 3 ng/L (ref <18)

## 2019-06-07 IMAGING — MR MR HEAD W/O CM
11 series · 42 of 48 positions shown · non-contrast
Comparison: None.

CLINICAL DATA: Left-sided numbness and tingling.  Upper back pain.

EXAM:
MRI HEAD WITHOUT CONTRAST
MRI CERVICAL SPINE WITHOUT CONTRAST
MRI THORACIC SPINE WITHOUT CONTRAST
TECHNIQUE: Multiplanar, multiecho pulse sequences of the brain and surrounding
structures, and cervical spine, to include the craniocervical
junction and cervicothoracic junction, and the thoracic spine were
obtained without intravenous contrast.

[Series 5: ax dwi_tracew · axial · 3.0mm · 0.60mm/px · z∈[+1,+155]mm · 3 of 48 slices shown]
[im 1/48]
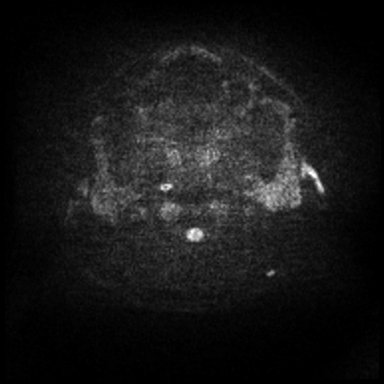
[im 24/48]
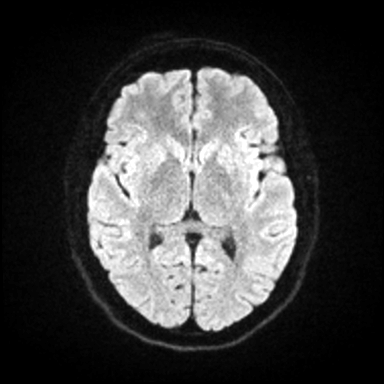
[im 48/48]
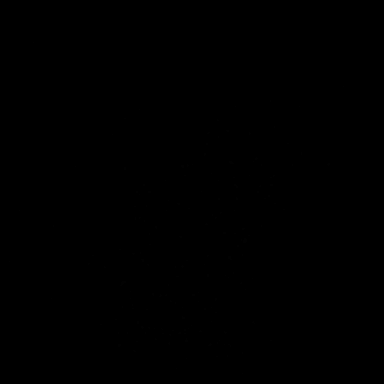

[Series 6: ax dwi_adc · axial · 3.0mm · 0.60mm/px · z∈[+1,+142]mm · 4 of 44 slices shown]
[im 1/44]
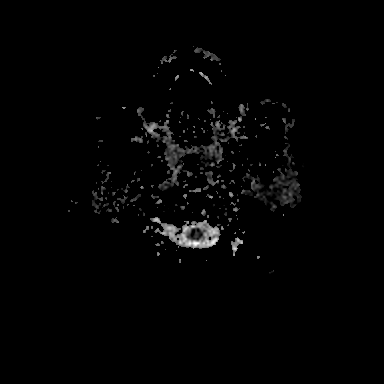
[im 15/44]
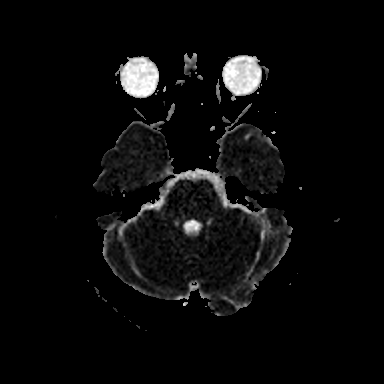
[im 29/44]
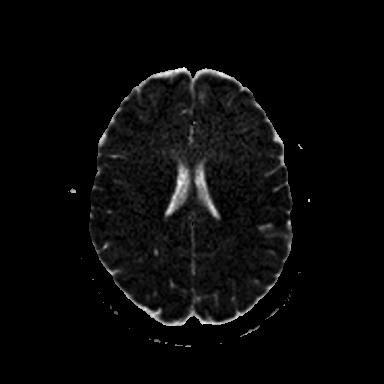
[im 44/44]
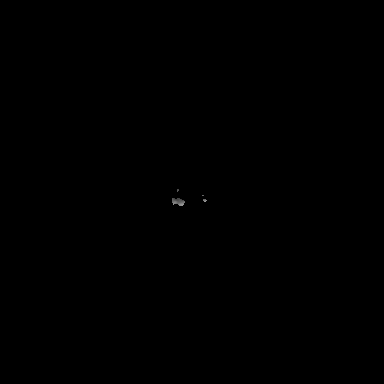

[Series 7: cor dwi_tracew · coronal · 5.0mm · 0.60mm/px · 3 of 36 slices shown]
[im 1/36]
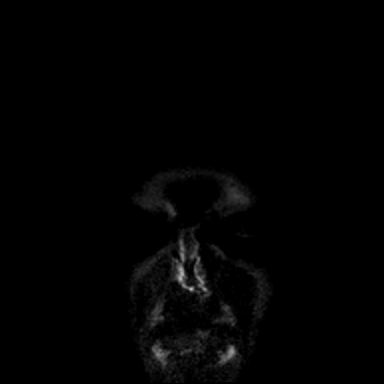
[im 18/36]
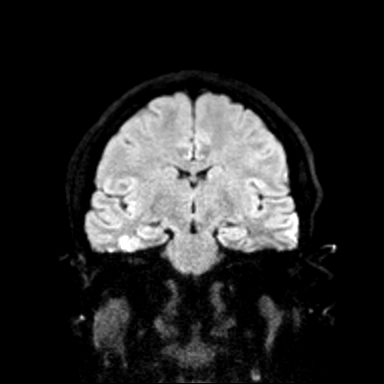
[im 36/36]
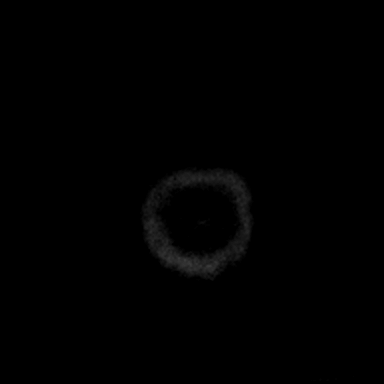

[Series 8: cor dwi_adc · coronal · 5.0mm · 0.60mm/px · 3 of 31 slices shown]
[im 1/31]
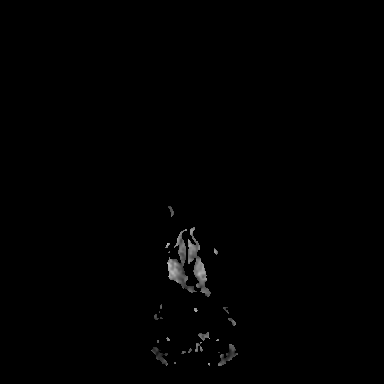
[im 16/31]
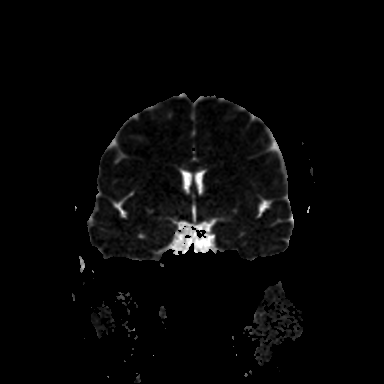
[im 31/31]
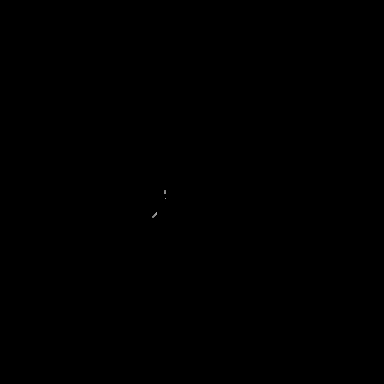

[Series 9: T1 · sagittal · 5.0mm · 0.62mm/px · 2 of 21 slices shown (1 of 2)]
[im 1/21]
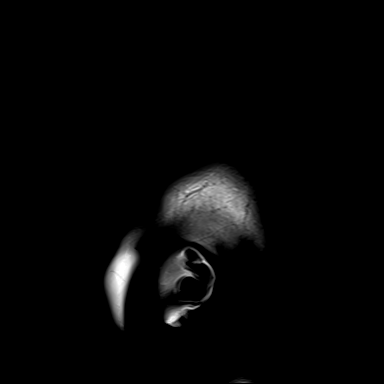
[im 21/21]
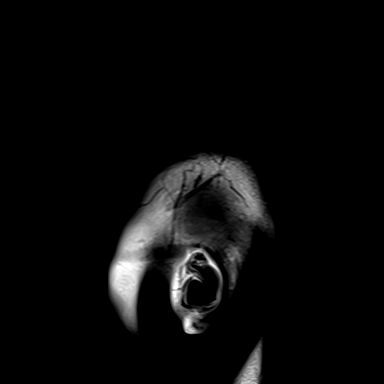

[Series 10: T2 · axial · 5.0mm · 0.53mm/px · z∈[+8,+152]mm · 2 of 25 slices shown (1 of 2)]
[im 1/25]
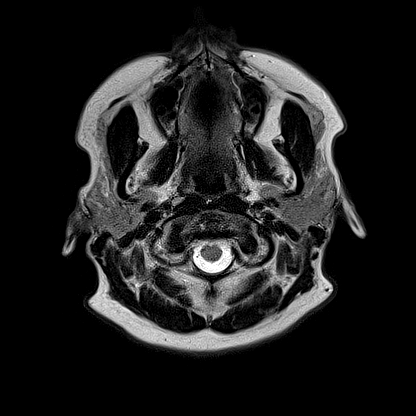
[im 25/25]
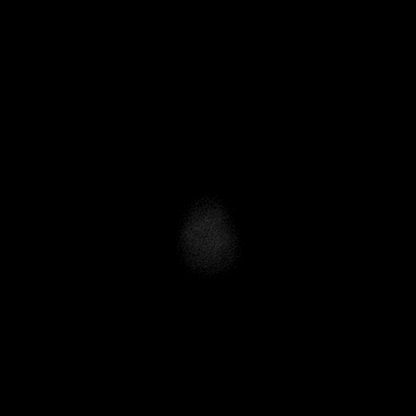

[Series 12: pha_images · axial · 3.0mm · 0.90mm/px · z∈[-8,+165]mm · 5 of 57 slices shown]
[im 1/57]
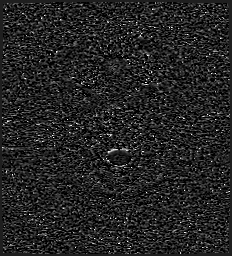
[im 15/57]
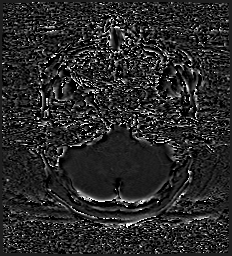
[im 29/57]
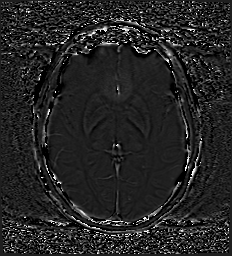
[im 43/57]
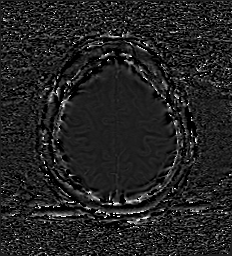
[im 57/57]
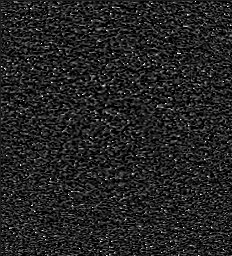

[Series 13: swi_images · axial · 3.0mm · 0.90mm/px · z∈[-8,+168]mm · 5 of 60 slices shown]
[im 1/60]
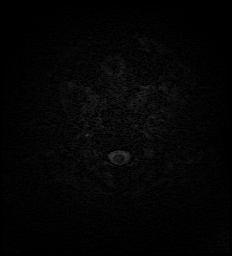
[im 15/60]
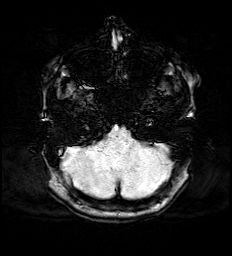
[im 30/60]
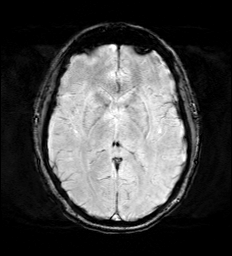
[im 45/60]
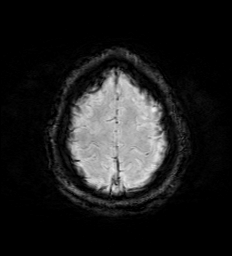
[im 60/60]
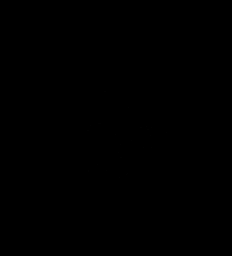

[Series 15: FLAIR · axial · 3.0mm · 0.53mm/px · z∈[-1,+161]mm · 5 of 55 slices shown]
[im 1/55]
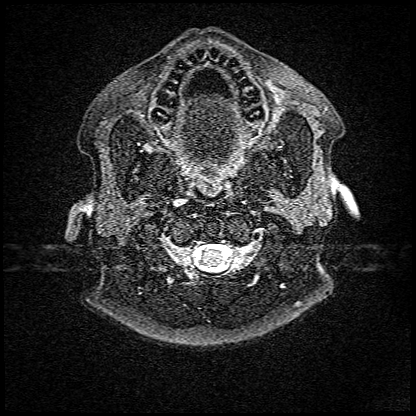
[im 14/55]
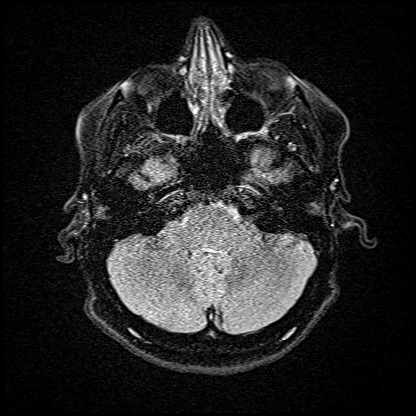
[im 28/55]
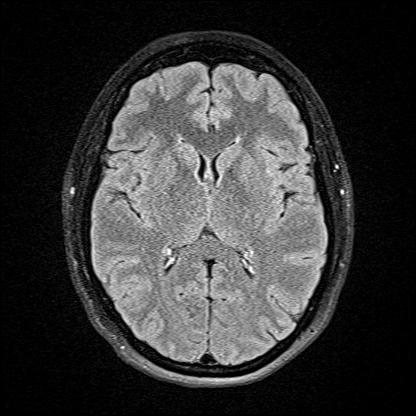
[im 41/55]
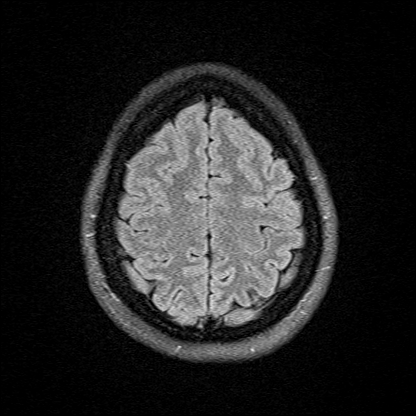
[im 55/55]
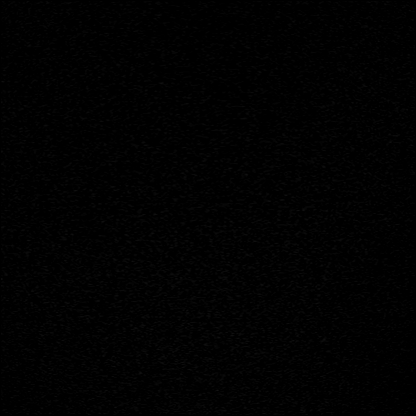

[Series 16: T1 · axial · 1.0mm · 0.98mm/px · z∈[+5,+163]mm · 8 of 160 slices shown (2 of 2)]
[im 1/160]
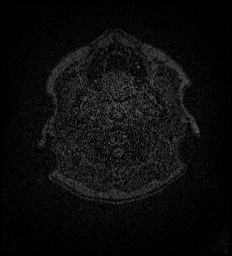
[im 25/160]
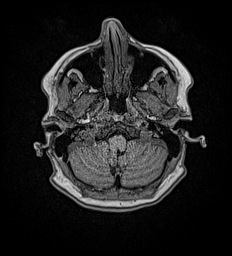
[im 49/160]
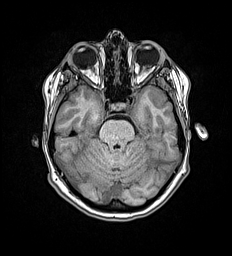
[im 74/160]
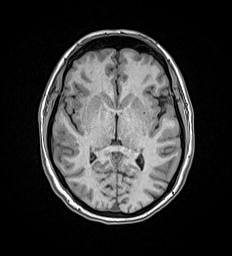
[im 86/160]
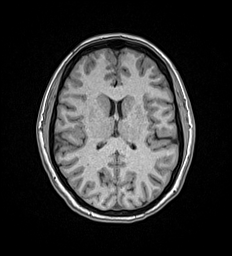
[im 111/160]
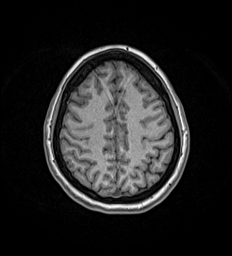
[im 135/160]
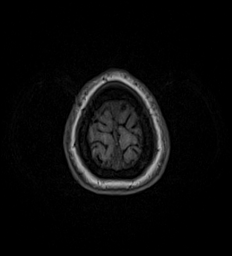
[im 160/160]
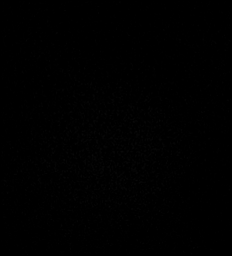

[Series 17: T2 · coronal · 5.0mm · 0.57mm/px · 2 of 27 slices shown (2 of 2)]
[im 1/27]
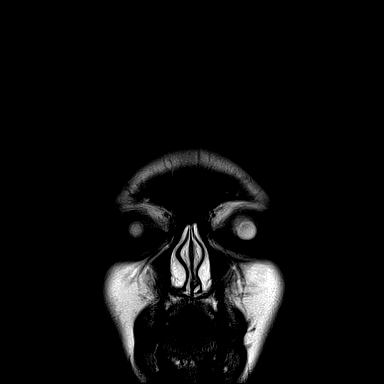
[im 27/27]
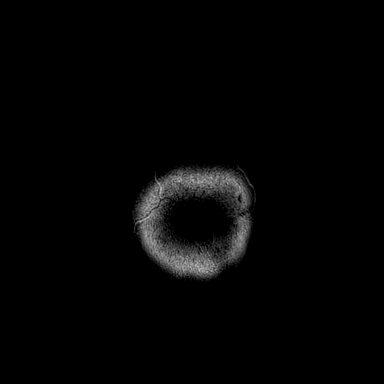

[42 of 48 positions shown; findings below may reference images not displayed]

FINDINGS: MRI HEAD FINDINGS

BRAIN: There is no acute infarct, acute hemorrhage or extra-axial
collection. The white matter signal is normal for the patient's age.
The cerebral and cerebellar volume are age-appropriate. There is no
hydrocephalus. The midline structures are normal.

VASCULAR: The major intracranial arterial and venous sinus flow
voids are normal. Susceptibility-sensitive sequences show no chronic
microhemorrhage or superficial siderosis.

SKULL: Normal marrow signal.

SINUSES/ORBITS: There are no fluid levels or advanced mucosal
thickening. The mastoid air cells and middle ear cavities are free
of fluid. The orbits are normal.

MRI CERVICAL SPINE FINDINGS

Alignment: Physiologic.

Vertebrae: No fracture, evidence of discitis, or bone lesion.

Cord: Normal signal and morphology.

Posterior Fossa, vertebral arteries, paraspinal tissues: Negative.

Disc levels: No spinal canal or neural foraminal stenosis. No disc
herniation.

MRI THORACIC SPINE FINDINGS

Alignment: Physiologic.

Vertebrae: No fracture, evidence of discitis, or bone lesion.

Cord: Normal signal and morphology.

Paraspinal tissues: Negative.

Disc levels: No disc herniation or spinal canal stenosis.
IMPRESSION: Normal MRI of the brain, cervical spine and thoracic spine.

## 2019-06-07 IMAGING — MR MR CERVICAL SPINE W/O CM
5 series · 39 of 48 positions shown · non-contrast
Comparison: None.

CLINICAL DATA: Left-sided numbness and tingling.  Upper back pain.

EXAM:
MRI HEAD WITHOUT CONTRAST
MRI CERVICAL SPINE WITHOUT CONTRAST
MRI THORACIC SPINE WITHOUT CONTRAST
TECHNIQUE: Multiplanar, multiecho pulse sequences of the brain and surrounding
structures, and cervical spine, to include the craniocervical
junction and cervicothoracic junction, and the thoracic spine were
obtained without intravenous contrast.

[Series 1: T2 · sagittal · 3.0mm · 0.62mm/px · 6 of 15 slices shown (1 of 2)]
[im 1/15]
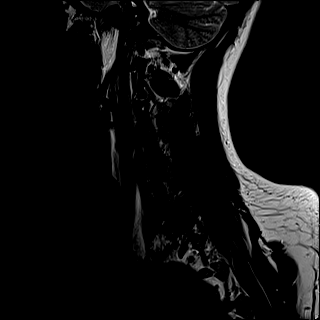
[im 3/15]
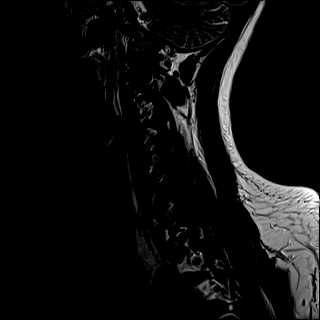
[im 6/15]
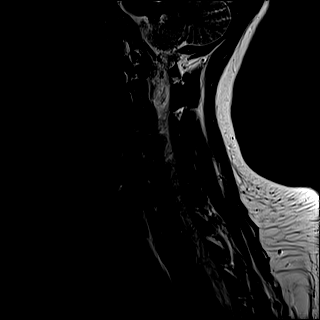
[im 9/15]
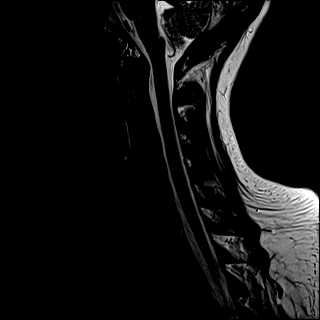
[im 12/15]
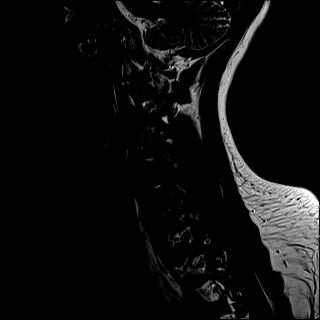
[im 15/15]
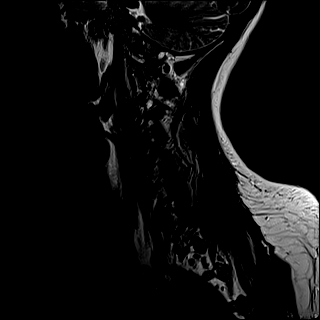

[Series 2: FLAIR · sagittal · 3.0mm · 0.78mm/px · 7 of 15 slices shown]
[im 1/15]
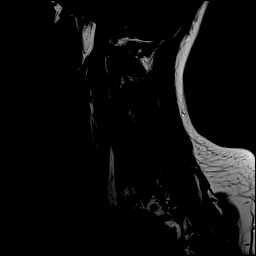
[im 3/15]
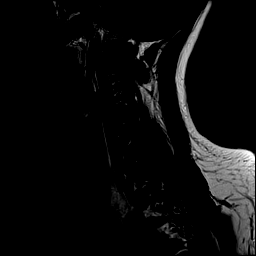
[im 5/15]
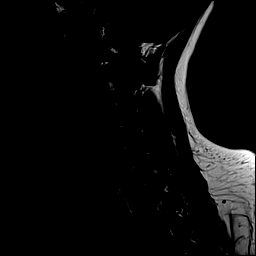
[im 8/15]
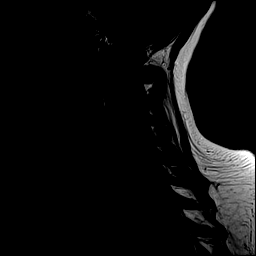
[im 10/15]
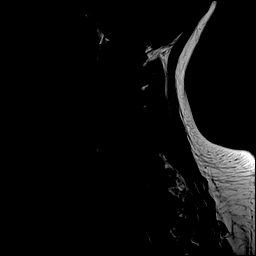
[im 12/15]
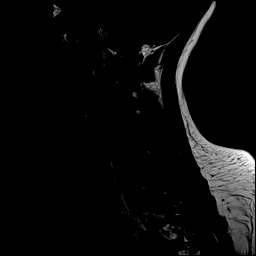
[im 15/15]
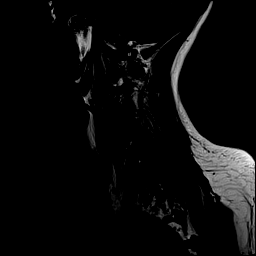

[Series 3: STIR · sagittal · 3.0mm · 0.62mm/px · 7 of 15 slices shown]
[im 1/15]
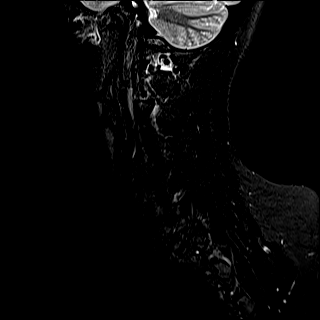
[im 3/15]
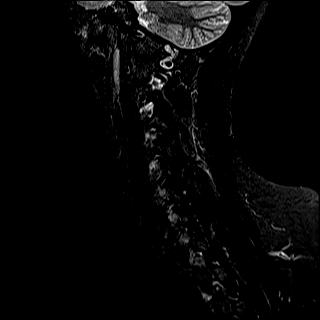
[im 5/15]
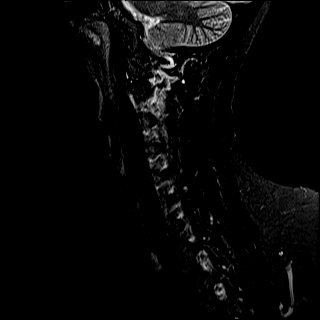
[im 8/15]
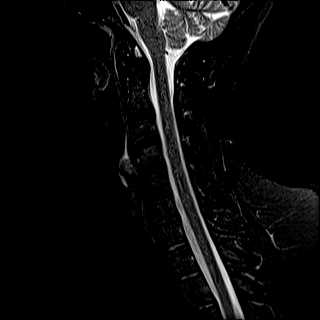
[im 10/15]
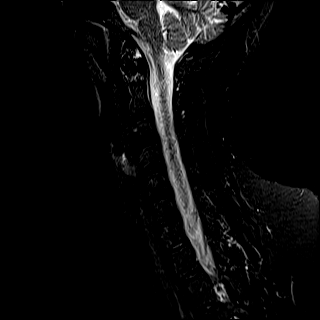
[im 12/15]
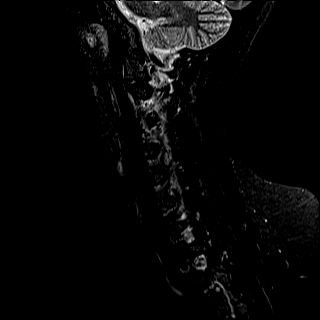
[im 15/15]
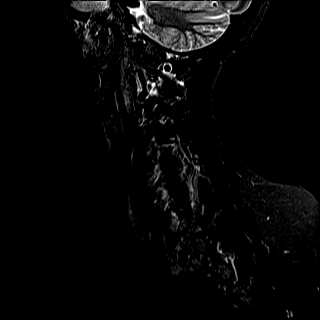

[Series 4: T2 · axial · 3.0mm · 0.70mm/px · z∈[-162,-73]mm · 11 of 29 slices shown (2 of 2)]
[im 1/29]
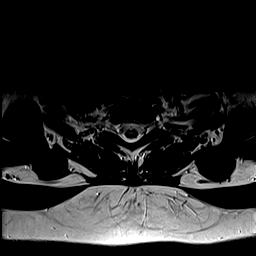
[im 3/29]
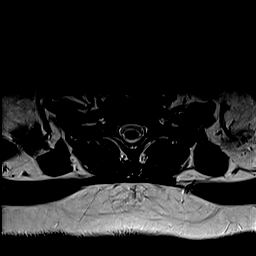
[im 5/29]
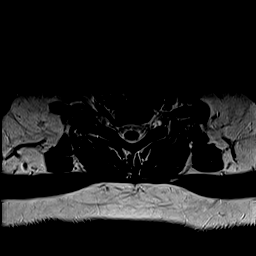
[im 7/29]
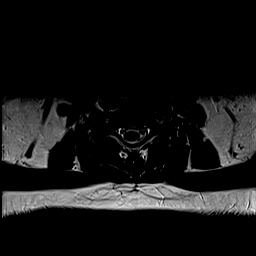
[im 9/29]
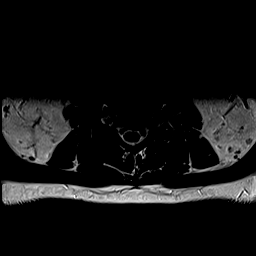
[im 11/29]
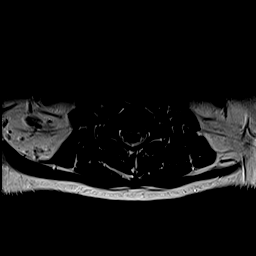
[im 13/29]
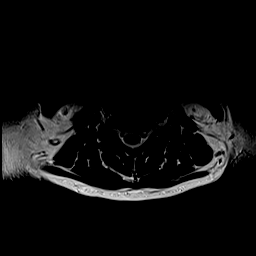
[im 16/29]
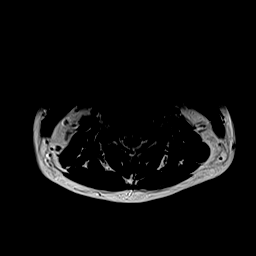
[im 20/29]
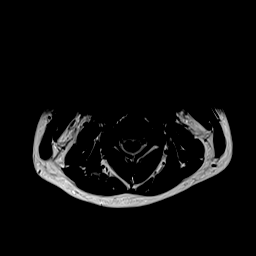
[im 24/29]
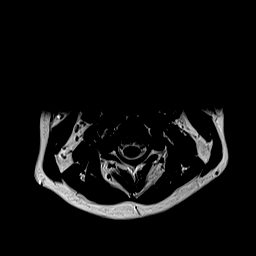
[im 29/29]
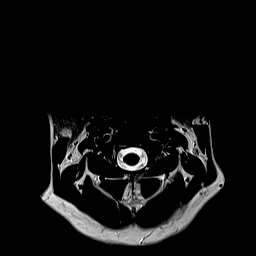

[Series 5: ax mpgr · axial · 3.0mm · 0.35mm/px · z∈[-162,-73]mm · 8 of 29 slices shown]
[im 1/29]
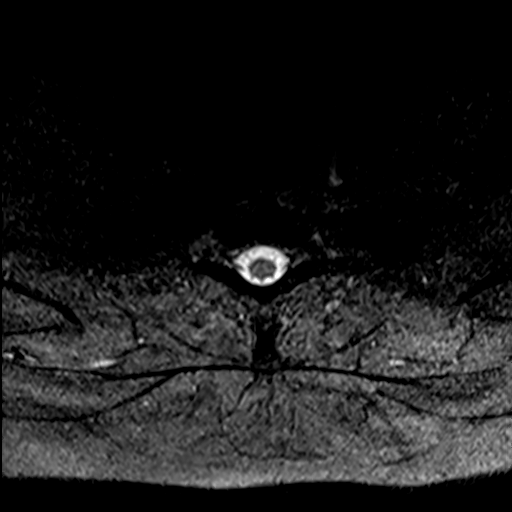
[im 5/29]
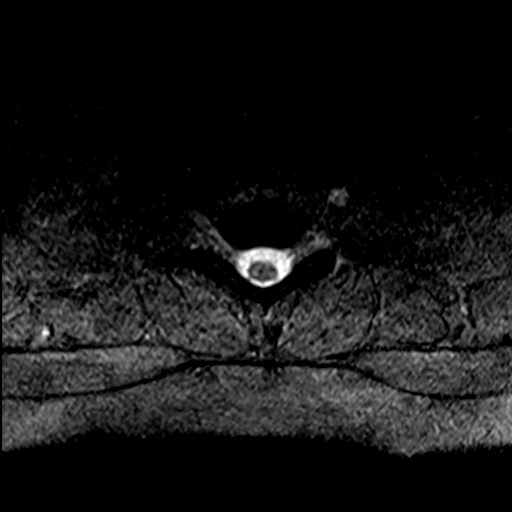
[im 9/29]
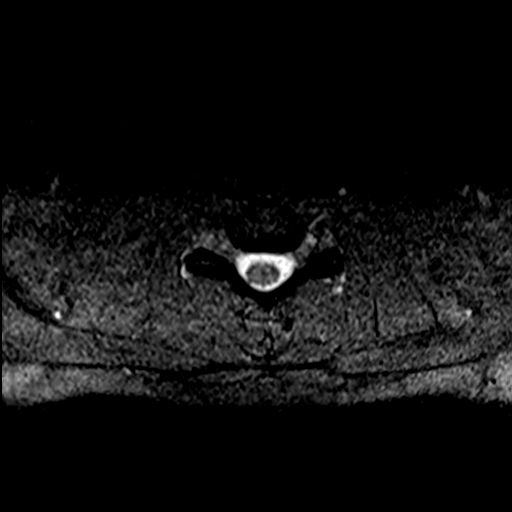
[im 13/29]
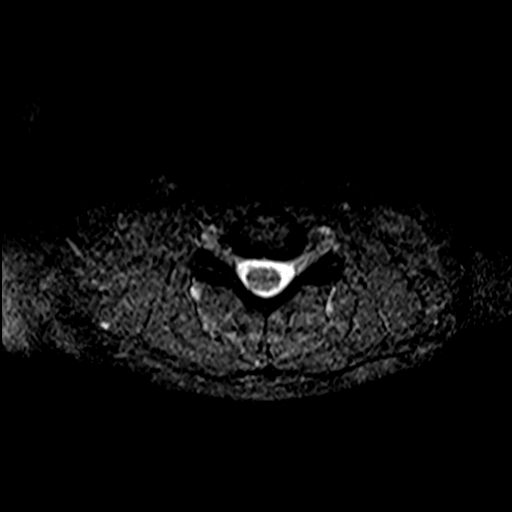
[im 16/29]
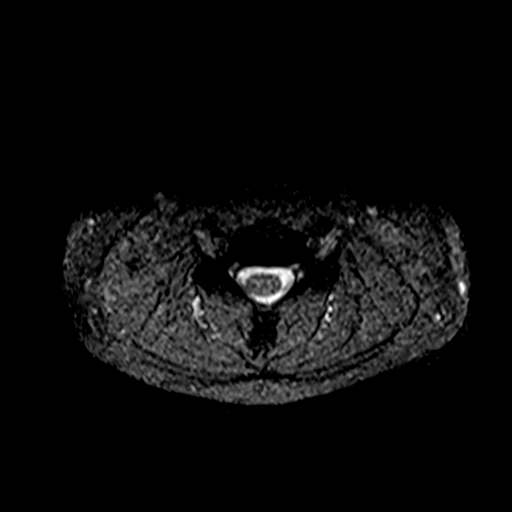
[im 20/29]
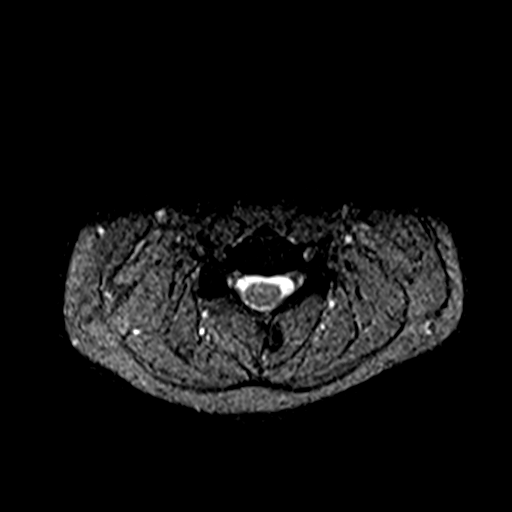
[im 24/29]
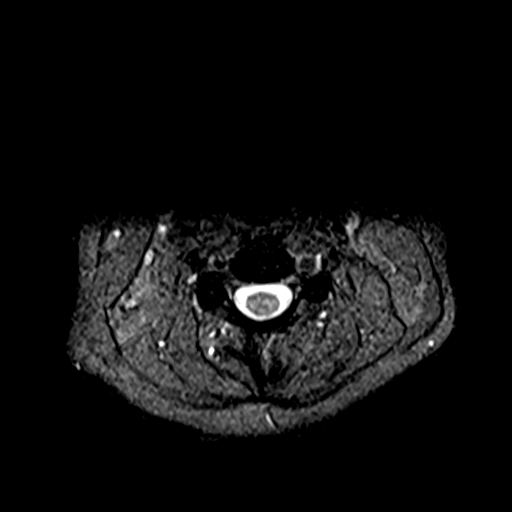
[im 29/29]
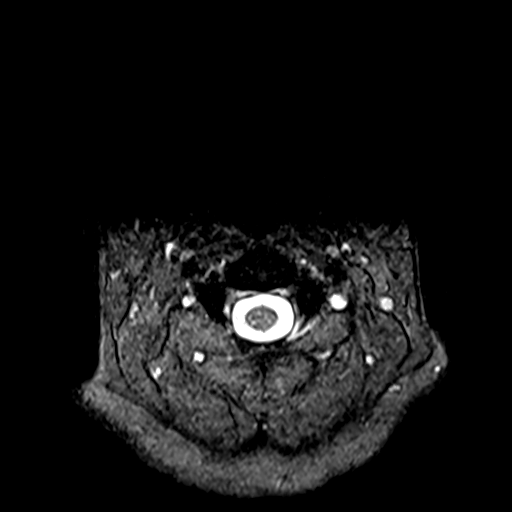

[39 of 48 positions shown; findings below may reference images not displayed]

FINDINGS: MRI HEAD FINDINGS

BRAIN: There is no acute infarct, acute hemorrhage or extra-axial
collection. The white matter signal is normal for the patient's age.
The cerebral and cerebellar volume are age-appropriate. There is no
hydrocephalus. The midline structures are normal.

VASCULAR: The major intracranial arterial and venous sinus flow
voids are normal. Susceptibility-sensitive sequences show no chronic
microhemorrhage or superficial siderosis.

SKULL: Normal marrow signal.

SINUSES/ORBITS: There are no fluid levels or advanced mucosal
thickening. The mastoid air cells and middle ear cavities are free
of fluid. The orbits are normal.

MRI CERVICAL SPINE FINDINGS

Alignment: Physiologic.

Vertebrae: No fracture, evidence of discitis, or bone lesion.

Cord: Normal signal and morphology.

Posterior Fossa, vertebral arteries, paraspinal tissues: Negative.

Disc levels: No spinal canal or neural foraminal stenosis. No disc
herniation.

MRI THORACIC SPINE FINDINGS

Alignment: Physiologic.

Vertebrae: No fracture, evidence of discitis, or bone lesion.

Cord: Normal signal and morphology.

Paraspinal tissues: Negative.

Disc levels: No disc herniation or spinal canal stenosis.
IMPRESSION: Normal MRI of the brain, cervical spine and thoracic spine.

## 2019-06-07 IMAGING — MR MR THORACIC SPINE W/O CM
5 of 6 series · 31 of 48 positions shown · non-contrast
Comparison: None.

CLINICAL DATA: Left-sided numbness and tingling.  Upper back pain.

EXAM:
MRI HEAD WITHOUT CONTRAST
MRI CERVICAL SPINE WITHOUT CONTRAST
MRI THORACIC SPINE WITHOUT CONTRAST
TECHNIQUE: Multiplanar, multiecho pulse sequences of the brain and surrounding
structures, and cervical spine, to include the craniocervical
junction and cervicothoracic junction, and the thoracic spine were
obtained without intravenous contrast.

[Series 18: T1 · sagittal · 6.0mm · 1.88mm/px · 4 of 9 slices shown (1 of 2)]
[im 1/9]
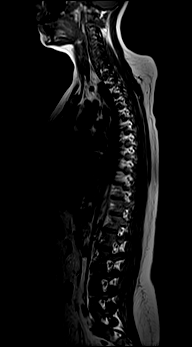
[im 3/9]
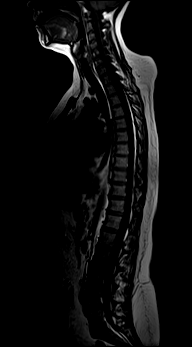
[im 6/9]
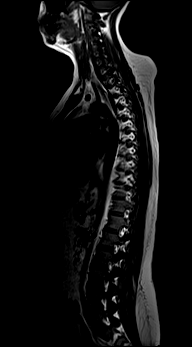
[im 9/9]
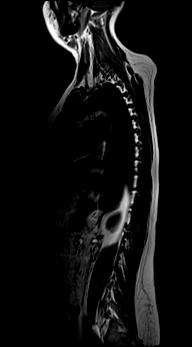

[Series 19: T2 · sagittal · 3.0mm · 1.06mm/px · 6 of 19 slices shown (1 of 2)]
[im 1/19]
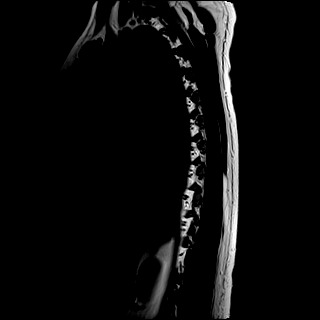
[im 4/19]
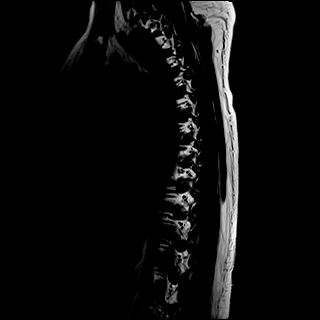
[im 8/19]
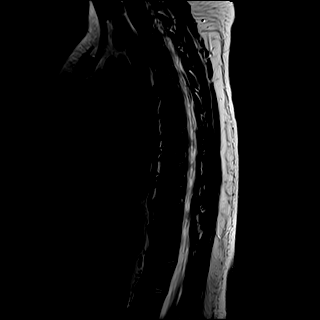
[im 11/19]
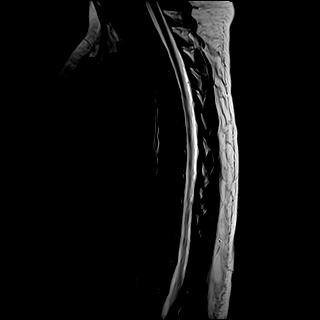
[im 15/19]
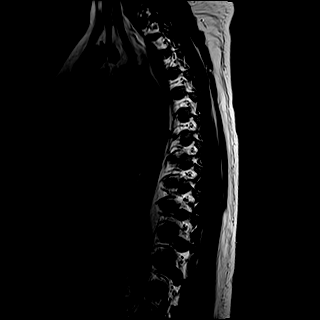
[im 19/19]
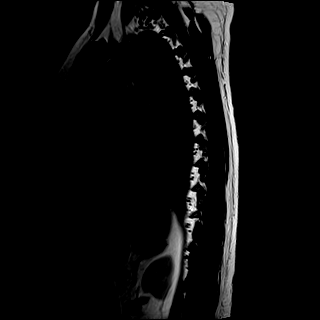

[Series 20: T1 · sagittal · 3.0mm · 1.06mm/px · 6 of 19 slices shown (2 of 2)]
[im 1/19]
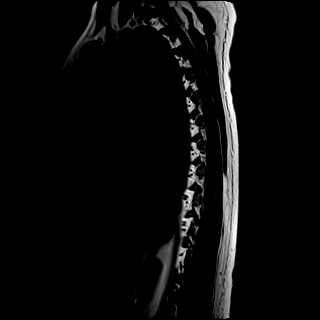
[im 4/19]
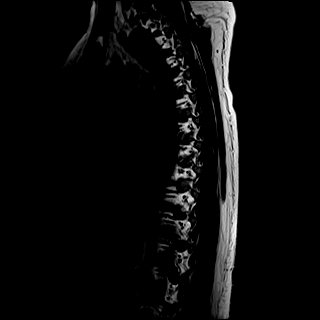
[im 8/19]
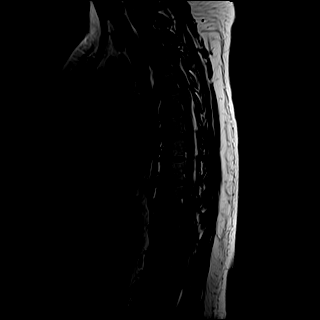
[im 11/19]
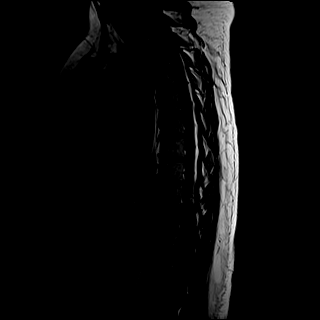
[im 15/19]
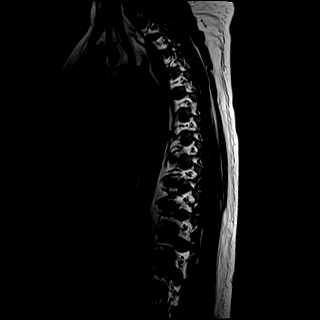
[im 19/19]
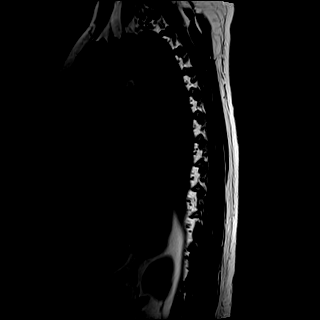

[Series 21: STIR · sagittal · 3.0mm · 0.53mm/px · 6 of 19 slices shown]
[im 1/19]
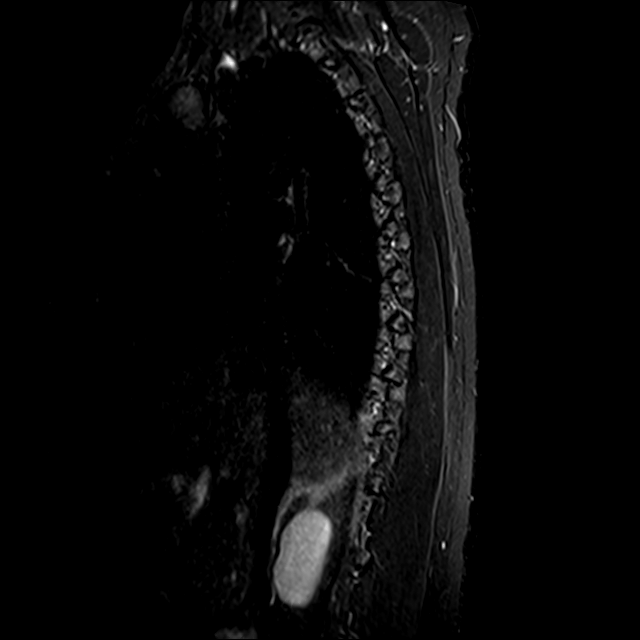
[im 4/19]
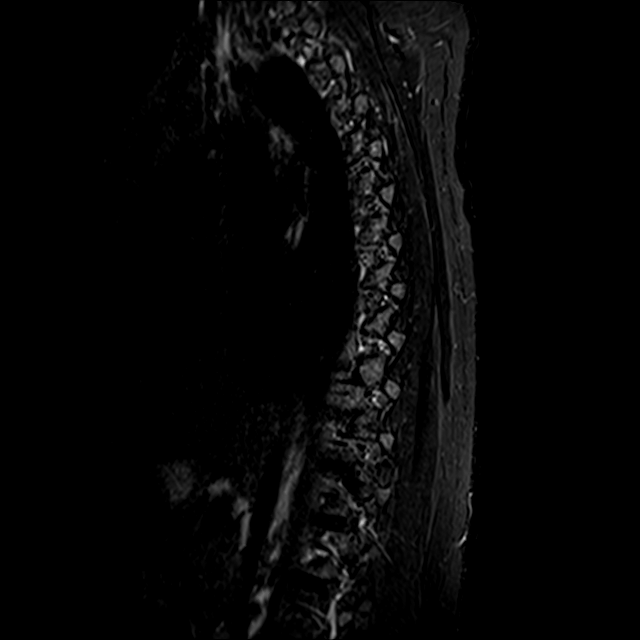
[im 8/19]
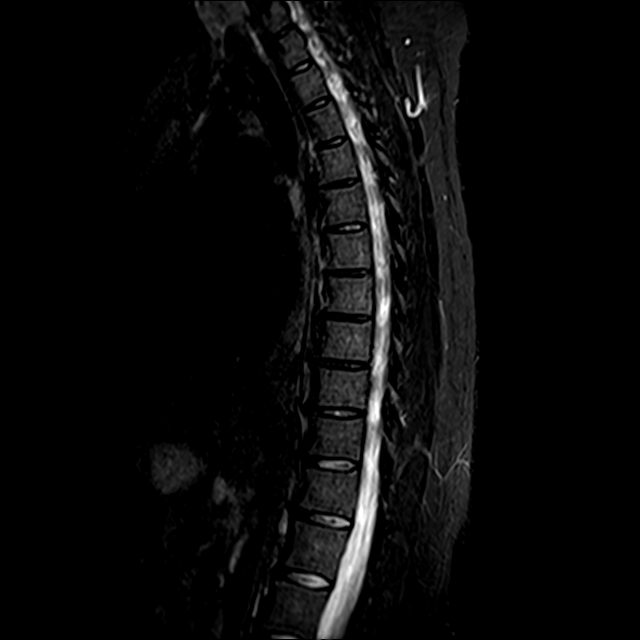
[im 11/19]
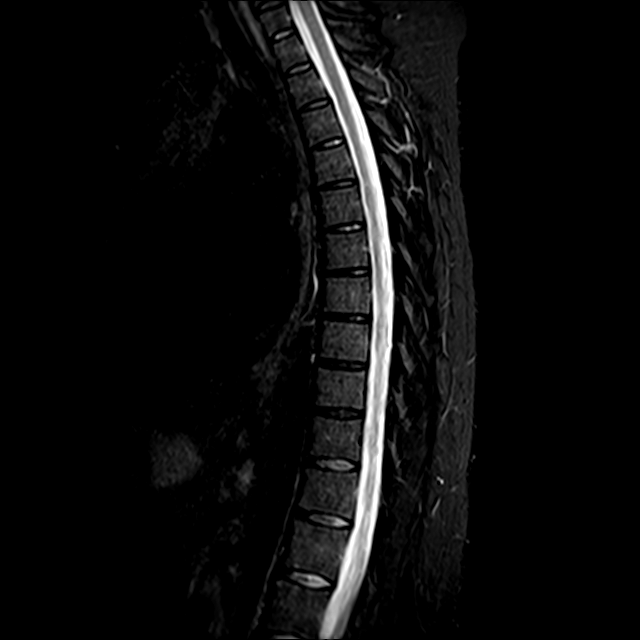
[im 15/19]
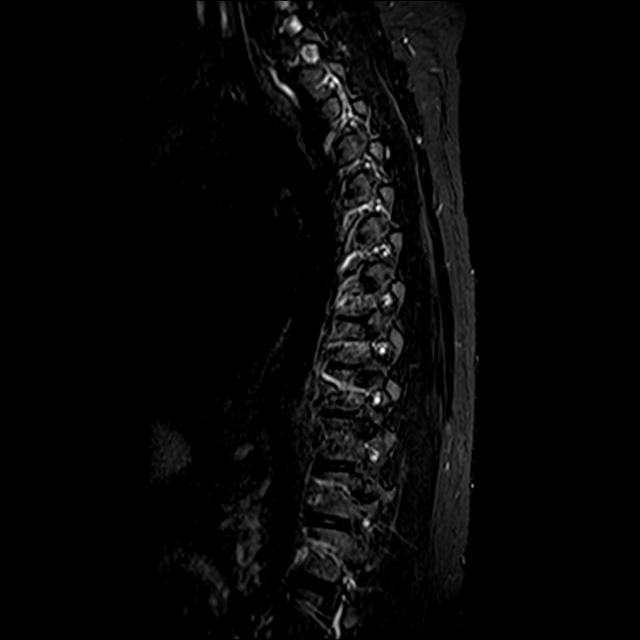
[im 19/19]
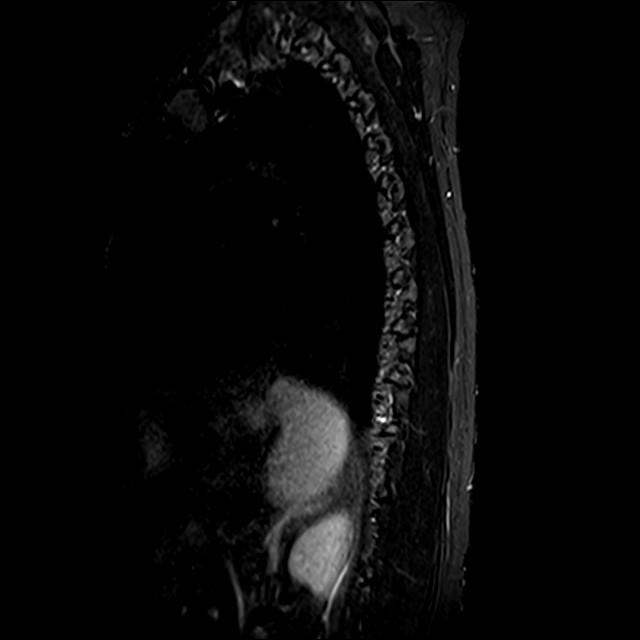

[Series 22: T2 · axial · 4.0mm · 0.59mm/px · z∈[-396,-154]mm · 9 of 39 slices shown (2 of 2)]
[im 1/39]
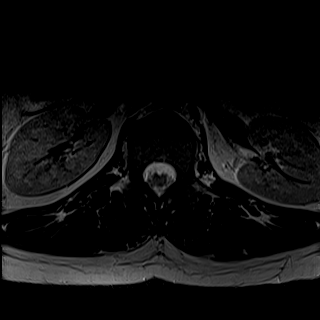
[im 7/39]
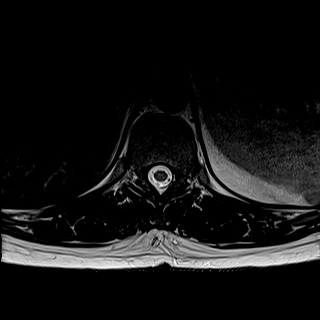
[im 13/39]
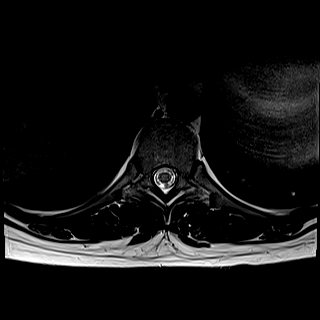
[im 16/39]
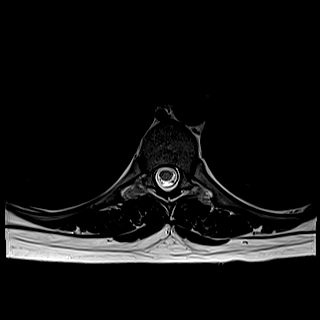
[im 20/39]
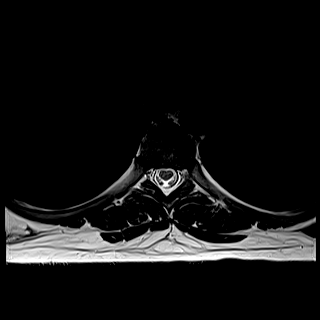
[im 23/39]
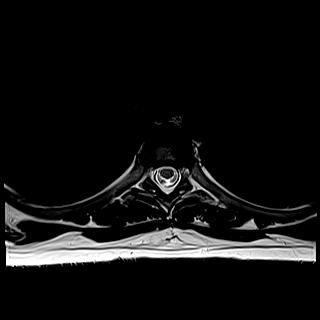
[im 26/39]
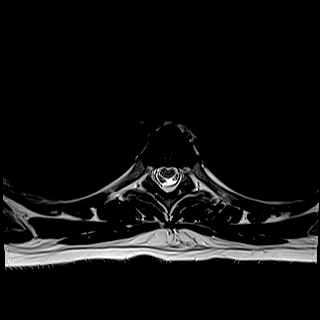
[im 32/39]
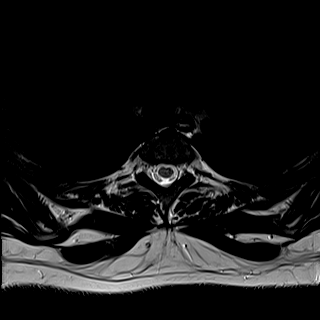
[im 39/39]
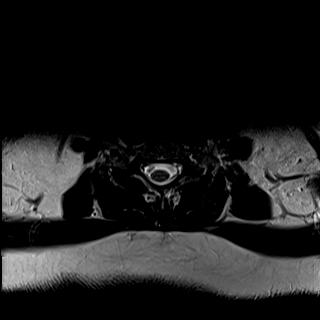

[31 of 48 positions shown; findings below may reference images not displayed]

FINDINGS: MRI HEAD FINDINGS

BRAIN: There is no acute infarct, acute hemorrhage or extra-axial
collection. The white matter signal is normal for the patient's age.
The cerebral and cerebellar volume are age-appropriate. There is no
hydrocephalus. The midline structures are normal.

VASCULAR: The major intracranial arterial and venous sinus flow
voids are normal. Susceptibility-sensitive sequences show no chronic
microhemorrhage or superficial siderosis.

SKULL: Normal marrow signal.

SINUSES/ORBITS: There are no fluid levels or advanced mucosal
thickening. The mastoid air cells and middle ear cavities are free
of fluid. The orbits are normal.

MRI CERVICAL SPINE FINDINGS

Alignment: Physiologic.

Vertebrae: No fracture, evidence of discitis, or bone lesion.

Cord: Normal signal and morphology.

Posterior Fossa, vertebral arteries, paraspinal tissues: Negative.

Disc levels: No spinal canal or neural foraminal stenosis. No disc
herniation.

MRI THORACIC SPINE FINDINGS

Alignment: Physiologic.

Vertebrae: No fracture, evidence of discitis, or bone lesion.

Cord: Normal signal and morphology.

Paraspinal tissues: Negative.

Disc levels: No disc herniation or spinal canal stenosis.
IMPRESSION: Normal MRI of the brain, cervical spine and thoracic spine.

## 2019-06-07 MED ORDER — DEXAMETHASONE SODIUM PHOSPHATE 10 MG/ML IJ SOLN
10.0000 mg | Freq: Once | INTRAMUSCULAR | Status: AC
  Administered 2019-06-07: 10 mg via INTRAVENOUS
  Filled 2019-06-07: qty 1

## 2019-06-07 MED ORDER — SODIUM CHLORIDE 0.9 % IV BOLUS
500.0000 mL | Freq: Once | INTRAVENOUS | Status: AC
  Administered 2019-06-07: 500 mL via INTRAVENOUS

## 2019-06-07 MED ORDER — KETOROLAC TROMETHAMINE 30 MG/ML IJ SOLN
15.0000 mg | Freq: Once | INTRAMUSCULAR | Status: AC
  Administered 2019-06-07: 15 mg via INTRAVENOUS
  Filled 2019-06-07: qty 1

## 2019-06-07 MED ORDER — DROPERIDOL 2.5 MG/ML IJ SOLN
2.5000 mg | Freq: Once | INTRAMUSCULAR | Status: AC
  Administered 2019-06-07: 2.5 mg via INTRAVENOUS
  Filled 2019-06-07: qty 2

## 2019-06-07 NOTE — ED Provider Notes (Signed)
[facility] Emergency Department Provider Note  ____________________________________________   First MD Initiated Contact with Patient 06/07/19 0007     (approximate)  I have reviewed the triage vital signs and the nursing notes.   HISTORY  Chief Complaint Chest Pain and Back Pain    HPI Ana Hudson is a 28 y.o. female with medical issues as listed below and the past medical history and active problem list.  She sees a neurologist named Dr. Charyl Dancer for ongoing issues with involuntary movement as well as chronic migraines.  She had an MRI of the brain a little over a month ago which was reassuring.  She takes Topamax for her symptoms.  She presents tonight reporting pain in the upper middle part of her back that has been gradually worsening over an extended period of time.  She first noted the pain about a year ago after an epidural injection that she says was in the upper middle part of her back in the area of her current pain.  It got better for a while and now it is worse.  She says the back pain is sharp and severe.  It is associated with new onset pain in her neck as well as numbness and tingling in her left arm.  She said that sometimes she has numbness down her left leg but that is not the current issue, it is mostly the numbness and tingling down the left arm.  No specific weakness.  She is very upset and concerned about the constellation of symptoms for which she has been suffering for an extended period of time particularly with the new onset of the symptoms described above.  She denies any known contact with COVID-19.  No fever or chills.  No chest pain, shortness of breath, nausea, vomiting, abdominal pain.  Nothing in particular makes the symptoms better or worse.        Past Medical History:  Diagnosis Date  . Anemia   . Endometriosis   . Heart murmur   . History of febrile seizure    <2 yrs old  . History of trichomoniasis 03/2016  . IBS  (irritable bowel syndrome)   . Postpartum hemorrhage   . Seizures [facility])     Patient Active Problem List   Diagnosis Date Noted  . Chronic PID (chronic pelvic inflammatory disease) 08/20/2018  . Borderline personality disorder (HCC) 07/28/2018  . Cigarette nicotine dependence without complication 07/28/2018  . History of cocaine abuse (HCC) 07/28/2018  . Marijuana dependence (HCC) 07/28/2018  . Chronic pelvic pain in female 06/29/2018  . Severe dysmenorrhea 06/29/2018  . Low grade squamous intraepith lesion on cytologic smear cervix (lgsil) 02/12/2018  . Obesity 01/12/2018  . Anxiety disorder, unspecified 03/07/2015  . Severe obesity (BMI >= 40) (HCC) 07/07/2014  . IBS (irritable bowel syndrome) - constipation and diarrhea 07/07/2014  . 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 Simple febrile seizure - at age 85 months, then again at 29 years of age 87/21/2016    Past Surgical History:  Procedure Laterality Date  . DILATION AND CURETTAGE OF UTERUS N/A 07/29/2014   Procedure: SUCTION DILATATION AND CURETTAGE;  Surgeon: Silverio Lay, MD;  Location: [facility] ORS;  Service: Gynecology;  Laterality: N/A;  . TONSILLECTOMY AND ADENOIDECTOMY      Prior to Admission medications   Medication Sig Start Date End Date Taking? Authorizing Provider  acetaminophen (TYLENOL) 500 MG tablet Take 1,000 mg by mouth every 6 (six) hours as  needed for mild pain.    [provider]  etonogestrel-ethinyl estradiol (NUVARING) 0.12-0.015 MG/24HR vaginal ring Place 1 Units vaginally every 21 ( twenty-one) days. 06/29/18   [provider]  FLUoxetine (PROZAC) 10 MG capsule Take 10 mg by mouth daily. 06/16/18   [provider]  gabapentin (NEURONTIN) 300 MG capsule Take one capsule by mouth in morning and two capsules at bedtime 08/20/18   Anyanwu, Ugonna A, MD  hydrOXYzine (ATARAX/VISTARIL) 25 MG tablet Take 25 mg by mouth daily as needed for anxiety. 07/28/18 07/28/19   [provider]  IBUPROFEN PO Take by mouth.    [provider]  meloxicam (MOBIC) 7.5 MG tablet Take 7.5 mg by mouth daily. 04/16/18   [provider]  naproxen (NAPROSYN) 500 MG tablet Take 1 tablet (500 mg total) by mouth 2 (two) times daily with a meal. As needed for pain 08/20/18   Anyanwu, Ugonna A, MD  oxyCODONE-acetaminophen (ROXICET) 5-325 MG tablet Take 1 tablet by mouth every 8 (eight) hours as needed for severe pain. Patient not taking: Reported on 08/20/2018 06/28/16   Mumaw, Hiram Comber, DO  Prenatal Vit-Fe Fumarate-FA (PRENATAL MULTIVITAMIN) TABS tablet Take 1 tablet by mouth daily at 12 noon.    [provider]  topiramate (TOPAMAX) 25 MG tablet Take by mouth. 08/03/18   [provider]    Allergies Nickel  No family history on file.  Social History Social History   Tobacco Use  . Smoking status: Current Every Day Smoker    Packs/day: 1.00    Types: Cigarettes  . Smokeless tobacco: Former Engineer, water Use Topics  . Alcohol use: Yes    Comment: rare when not pregnant  . Drug use: Yes    Types: Marijuana    Review of Systems Constitutional: No fever/chills Eyes: No visual changes. ENT: No sore throat. Cardiovascular: Denies chest pain. Respiratory: Denies shortness of breath. Gastrointestinal: No abdominal pain.  No nausea, no vomiting.  No diarrhea.  No constipation. Genitourinary: Negative for dysuria. Musculoskeletal: Neck pain and upper middle back pain. Integumentary: Negative for rash. Neurological: Chronic headache/migraine, neck pain with numbness and tingling radiating down the left arm.   ____________________________________________   PHYSICAL EXAM:  VITAL SIGNS: ED Triage Vitals  Enc Vitals Group     BP 06/06/19 2341 139/68     Pulse Rate 06/06/19 2341 72     Resp 06/06/19 2341 16     Temp 06/06/19 2341 98.7 F (37.1 C)     Temp Source 06/06/19 2341 Oral     SpO2 06/06/19 2341 100 %      Weight 06/06/19 2344 101.2 kg (223 lb)     Height 06/06/19 2344 1.727 m ( )     Head Circumference --      Peak Flow --      Pain Score 06/06/19 2343 8     Pain Loc --      Pain Edu? --      Excl. in GC? --     Constitutional: Alert and oriented.  Anxious and concerned about her symptoms, appears uncomfortable. Eyes: Conjunctivae are normal.  Head: Atraumatic. Nose: No congestion/rhinnorhea. Mouth/Throat: Patient is wearing a mask. Neck: No stridor.  No meningeal signs.   Cardiovascular: Normal rate, regular rhythm. Good peripheral circulation. Grossly normal heart sounds. Respiratory: Normal respiratory effort.  No retractions. Gastrointestinal: Soft and nontender. No distention.  Musculoskeletal: No step-offs or deformities along the cervical and thoracic spine.  Patient indicates pain  in the midline of the upper to middle thoracic region but there are no skin changes or palpable abnormalities. Neurologic:  Normal speech and language. No gross focal neurologic deficits are appreciated though she does report subjective paresthesias of the left arm.  No weakness appreciated on neurological exam. Skin:  Skin is warm, dry and intact. Psychiatric: Mood and affect are somewhat anxious and tearful when describing the symptoms she is experiencing.  ____________________________________________   LABS (all labs ordered are listed, but only abnormal results are displayed)  Labs Reviewed  CBC - Abnormal; Notable for the following components:      Result Value   WBC 11.6 (*)    Hemoglobin 15.1 (*)    All other components within normal limits  BASIC METABOLIC PANEL  POC URINE PREG, ED  POCT PREGNANCY, URINE  TROPONIN I (HIGH SENSITIVITY)   ____________________________________________  EKG  ED ECG REPORT I, Hinda Kehr, the attending physician, personally viewed and interpreted this ECG.  Date: 06/06/2019 EKG Time: 23: 44 Rate: 71 Rhythm: normal sinus rhythm QRS Axis:  normal Intervals: normal ST/T Wave abnormalities: normal Narrative Interpretation: no evidence of acute ischemia  ____________________________________________  RADIOLOGY I, Hinda Kehr, personally viewed and evaluated these images (plain radiographs) as part of my medical decision making, as well as reviewing the written report by the radiologist.  ED MD interpretation:  No acute abnormalities  Official radiology report(s): DG Chest 2 View  Result Date: 06/07/2019 CLINICAL DATA:  Chest pain EXAM: CHEST - 2 VIEW COMPARISON:  02/03/2013 FINDINGS: The heart size and mediastinal contours are within normal limits. Both lungs are clear. The visualized skeletal structures are unremarkable. IMPRESSION: No active cardiopulmonary disease. Electronically Signed   By: Ulyses Jarred M.D.   On: 06/07/2019 00:06   MR BRAIN WO CONTRAST  Result Date: 06/07/2019 CLINICAL DATA:  Left-sided numbness and tingling.  Upper back pain. EXAM: MRI HEAD WITHOUT CONTRAST MRI CERVICAL SPINE WITHOUT CONTRAST MRI THORACIC SPINE WITHOUT CONTRAST TECHNIQUE: Multiplanar, multiecho pulse sequences of the brain and surrounding structures, and cervical spine, to include the craniocervical junction and cervicothoracic junction, and the thoracic spine were obtained without intravenous contrast. COMPARISON:  None. FINDINGS: MRI HEAD FINDINGS BRAIN: There is no acute infarct, acute hemorrhage or extra-axial collection. The white matter signal is normal for the patient's age. The cerebral and cerebellar volume are age-appropriate. There is no hydrocephalus. The midline structures are normal. VASCULAR: The major intracranial arterial and venous sinus flow voids are normal. Susceptibility-sensitive sequences show no chronic microhemorrhage or superficial siderosis. SKULL: Normal marrow signal. SINUSES/ORBITS: There are no fluid levels or advanced mucosal thickening. The mastoid air cells and middle ear cavities are free of fluid. The  orbits are normal. MRI CERVICAL SPINE FINDINGS Alignment: Physiologic. Vertebrae: No fracture, evidence of discitis, or bone lesion. Cord: Normal signal and morphology. Posterior Fossa, vertebral arteries, paraspinal tissues: Negative. Disc levels: No spinal canal or neural foraminal stenosis. No disc herniation. MRI THORACIC SPINE FINDINGS Alignment: Physiologic. Vertebrae: No fracture, evidence of discitis, or bone lesion. Cord: Normal signal and morphology. Paraspinal tissues: Negative. Disc levels: No disc herniation or spinal canal stenosis. IMPRESSION: Normal MRI of the brain, cervical spine and thoracic spine. Electronically Signed   By: Ulyses Jarred M.D.   On: 06/07/2019 02:43   MR Cervical Spine Wo Contrast  Result Date: 06/07/2019 CLINICAL DATA:  Left-sided numbness and tingling.  Upper back pain. EXAM: MRI HEAD WITHOUT CONTRAST MRI CERVICAL SPINE WITHOUT CONTRAST MRI THORACIC SPINE WITHOUT CONTRAST TECHNIQUE: Multiplanar,  multiecho pulse sequences of the brain and surrounding structures, and cervical spine, to include the craniocervical junction and cervicothoracic junction, and the thoracic spine were obtained without intravenous contrast. COMPARISON:  None. FINDINGS: MRI HEAD FINDINGS BRAIN: There is no acute infarct, acute hemorrhage or extra-axial collection. The white matter signal is normal for the patient's age. The cerebral and cerebellar volume are age-appropriate. There is no hydrocephalus. The midline structures are normal. VASCULAR: The major intracranial arterial and venous sinus flow voids are normal. Susceptibility-sensitive sequences show no chronic microhemorrhage or superficial siderosis. SKULL: Normal marrow signal. SINUSES/ORBITS: There are no fluid levels or advanced mucosal thickening. The mastoid air cells and middle ear cavities are free of fluid. The orbits are normal. MRI CERVICAL SPINE FINDINGS Alignment: Physiologic. Vertebrae: No fracture, evidence of discitis, or bone  lesion. Cord: Normal signal and morphology. Posterior Fossa, vertebral arteries, paraspinal tissues: Negative. Disc levels: No spinal canal or neural foraminal stenosis. No disc herniation. MRI THORACIC SPINE FINDINGS Alignment: Physiologic. Vertebrae: No fracture, evidence of discitis, or bone lesion. Cord: Normal signal and morphology. Paraspinal tissues: Negative. Disc levels: No disc herniation or spinal canal stenosis. IMPRESSION: Normal MRI of the brain, cervical spine and thoracic spine. Electronically Signed   By: Deatra RobinsonKevin  Herman M.D.   On: 06/07/2019 02:43   MR THORACIC SPINE WO CONTRAST  Result Date: 06/07/2019 CLINICAL DATA:  Left-sided numbness and tingling.  Upper back pain. EXAM: MRI HEAD WITHOUT CONTRAST MRI CERVICAL SPINE WITHOUT CONTRAST MRI THORACIC SPINE WITHOUT CONTRAST TECHNIQUE: Multiplanar, multiecho pulse sequences of the brain and surrounding structures, and cervical spine, to include the craniocervical junction and cervicothoracic junction, and the thoracic spine were obtained without intravenous contrast. COMPARISON:  None. FINDINGS: MRI HEAD FINDINGS BRAIN: There is no acute infarct, acute hemorrhage or extra-axial collection. The white matter signal is normal for the patient's age. The cerebral and cerebellar volume are age-appropriate. There is no hydrocephalus. The midline structures are normal. VASCULAR: The major intracranial arterial and venous sinus flow voids are normal. Susceptibility-sensitive sequences show no chronic microhemorrhage or superficial siderosis. SKULL: Normal marrow signal. SINUSES/ORBITS: There are no fluid levels or advanced mucosal thickening. The mastoid air cells and middle ear cavities are free of fluid. The orbits are normal. MRI CERVICAL SPINE FINDINGS Alignment: Physiologic. Vertebrae: No fracture, evidence of discitis, or bone lesion. Cord: Normal signal and morphology. Posterior Fossa, vertebral arteries, paraspinal tissues: Negative. Disc levels: No  spinal canal or neural foraminal stenosis. No disc herniation. MRI THORACIC SPINE FINDINGS Alignment: Physiologic. Vertebrae: No fracture, evidence of discitis, or bone lesion. Cord: Normal signal and morphology. Paraspinal tissues: Negative. Disc levels: No disc herniation or spinal canal stenosis. IMPRESSION: Normal MRI of the brain, cervical spine and thoracic spine. Electronically Signed   By: Deatra RobinsonKevin  Herman M.D.   On: 06/07/2019 02:43    ____________________________________________   PROCEDURES   Procedure(s) performed (including Critical Care):  Procedures   ____________________________________________   INITIAL IMPRESSION / MDM / ASSESSMENT AND PLAN / ED COURSE  As part of my medical decision making, I reviewed the following data within the electronic MEDICAL RECORD NUMBER Nursing notes reviewed and incorporated, Labs reviewed , EKG interpreted , Old chart reviewed, Notes from prior ED visits and [place] Controlled Substance Database   Differential diagnosis includes, but is not limited to, cervical radiculopathy, polyradiculitis, epidural abscess, osteomyelitis/discitis, transverse myelitis, chronic complicated migraine.  I discussed with the patient the fact that I believe that anxiety is also contributing to her symptoms.  She is quite  concerned and tearful and is frustrated at the ongoing nature of her symptoms in spite of multiple visits to various specialists.  Her vital signs are reassuring and she is afebrile and not tachycardic.  She has a normal basic metabolic panel, urine pregnancy test negative, normal troponin, normal CBC except for a very mild leukocytosis.  I discussed with her that I thought it was very likely that we would not identify a new or emergent condition and that I believe that her symptoms are likely due to chronic cervical radiculopathy as well as an anxiety component and chronic migraine.  I recommended that we treat the migraine with a "migraine cocktail"  (droperidol 2.5 mg IV, Toradol 50 mg IV, and Decadron 10 mg IV, as well as 500 L normal saline IV bolus).  Given the symptoms that she is describing, I will also obtain MRIs of the brain, cervical spine, and thoracic spine to rule out any emergent condition that is resulting in her pain as well as the polyradiculitis.  She agrees with the plan.  In the absence of an emergent condition on tonight's work-up, I explained that she will need to follow-up with her neurologist and she understands and agrees with the current plan.      Clinical Course as of Jun 06 318  Mon Jun 07, 2019  3532 No evidence of any abnormalities based on the radiology report from the MRIs.  Patient has been sleeping comfortably and when awakened she reports a pain of 6 out of 10, however she describes this is tolerable.  I reassured the patient that there is no evidence of any emergent medical condition at this time and I encouraged close outpatient follow-up with the neurologist and her regular doctor.  I gave my usual and customary return precautions.   [CF]    Clinical Course User Index [CF] Loleta Rose, MD     ____________________________________________  FINAL CLINICAL IMPRESSION(S) / ED DIAGNOSES  Final diagnoses:  Midline thoracic back pain, unspecified chronicity  Arm paresthesia, left     MEDICATIONS GIVEN DURING THIS VISIT:  Medications  sodium chloride flush (NS) 0.9 % injection 3 mL (3 mLs Intravenous Not Given 06/07/19 0100)  droperidol (INAPSINE) 2.5 MG/ML injection 2.5 mg (2.5 mg Intravenous Given 06/07/19 0053)  ketorolac (TORADOL) 30 MG/ML injection 15 mg (15 mg Intravenous Given 06/07/19 0054)  dexamethasone (DECADRON) injection 10 mg (10 mg Intravenous Given 06/07/19 0054)  sodium chloride 0.9 % bolus 500 mL (500 mLs Intravenous New Bag/Given 06/07/19 0052)     ED Discharge Orders    None      *Please note:  Ana Hudson was evaluated in Emergency Department on 06/07/2019 for the  symptoms described in the history of present illness. She was evaluated in the context of the global COVID-19 pandemic, which necessitated consideration that the patient might be at risk for infection with the SARS-CoV-2 virus that causes COVID-19. Institutional protocols and algorithms that pertain to the evaluation of patients at risk for COVID-19 are in a state of rapid change based on information released by regulatory bodies including the CDC and federal and state organizations. These policies and algorithms were followed during the patient's care in the ED.  Some ED evaluations and interventions may be delayed as a result of limited staffing during the pandemic.*  Note:  This document was prepared using Dragon voice recognition software and may include unintentional dictation errors.   Loleta Rose, MD 06/07/19 [phone]

## 2019-06-07 NOTE — ED Notes (Signed)
Reviewed discharge instructions and follow-up care with patient. Patient verbalized understanding of all information reviewed. Patient stable, with no distress noted at this time.    

## 2019-06-07 NOTE — Discharge Instructions (Addendum)
Your workup in the Emergency Department today was reassuring.  We did not find any specific abnormalities.  Please let your regular doctors know that you had MRIs tonight in the emergency department of your brain, cervical spine, and thoracic spine, and there were no abnormalities identified.    We recommend you drink plenty of fluids, take your regular medications and/or any new ones prescribed today, and follow up with the doctor(s) listed in these documents as recommended.  Return to the Emergency Department if you develop new or worsening symptoms that concern you.

## 2023-12-29 ENCOUNTER — Emergency Department
Admission: EM | Admit: 2023-12-29 | Discharge: 2023-12-29 | Disposition: A | Discharge status: Home / Self Care | Attending: Emergency Medicine | Admitting: Emergency Medicine

## 2023-12-29 ENCOUNTER — Emergency Department

## 2023-12-29 ENCOUNTER — Other Ambulatory Visit: Payer: Self-pay

## 2023-12-29 DIAGNOSIS — R1032 Left lower quadrant pain: Secondary | ICD-10-CM | POA: Insufficient documentation

## 2023-12-29 DIAGNOSIS — R109 Unspecified abdominal pain: Secondary | ICD-10-CM

## 2023-12-29 LAB — URINALYSIS, ROUTINE W REFLEX MICROSCOPIC
Bilirubin Urine: NEGATIVE
Glucose, UA: NEGATIVE mg/dL
Hgb urine dipstick: NEGATIVE
Ketones, ur: NEGATIVE mg/dL
Leukocytes,Ua: NEGATIVE
Nitrite: NEGATIVE
Protein, ur: NEGATIVE mg/dL
Specific Gravity, Urine: 1.02 (ref 1.005–1.030)
pH: 5 (ref 5.0–8.0)

## 2023-12-29 LAB — BASIC METABOLIC PANEL WITH GFR
Anion gap: 5 (ref 5–15)
BUN: 11 mg/dL (ref 6–20)
CO2: 24 mmol/L (ref 22–32)
Calcium: 8.9 mg/dL (ref 8.9–10.3)
Chloride: 109 mmol/L (ref 98–111)
Creatinine, Ser: 0.57 mg/dL (ref 0.44–1.00)
GFR, Estimated: >60 mL/min (ref >60)
Glucose, Bld: 103 mg/dL — ABNORMAL HIGH (ref 70–99)
Potassium: 4 mmol/L (ref 3.5–5.1)
Sodium: 138 mmol/L (ref 135–145)

## 2023-12-29 LAB — POC URINE PREG, ED: Preg Test, Ur: NEGATIVE

## 2023-12-29 LAB — HEPATIC FUNCTION PANEL
ALT: 21 U/L (ref 0–44)
AST: 17 U/L (ref 15–41)
Albumin: 4.2 g/dL (ref 3.5–5.0)
Alkaline Phosphatase: 52 U/L (ref 38–126)
Bilirubin, Direct: <0.1 mg/dL (ref 0.0–0.2)
Total Bilirubin: 0.5 mg/dL (ref 0.0–1.2)
Total Protein: 6.9 g/dL (ref 6.5–8.1)

## 2023-12-29 LAB — CBC
HCT: 39.7 % (ref 36.0–46.0)
Hemoglobin: 13.6 g/dL (ref 12.0–15.0)
MCH: 31.1 pg (ref 26.0–34.0)
MCHC: 34.3 g/dL (ref 30.0–36.0)
MCV: 90.6 fL (ref 80.0–100.0)
Platelets: 242 K/uL (ref 150–400)
RBC: 4.38 MIL/uL (ref 3.87–5.11)
RDW: 12.3 % (ref 11.5–15.5)
WBC: 10.4 K/uL (ref 4.0–10.5)
nRBC: 0 % (ref 0.0–0.2)

## 2023-12-29 LAB — LIPASE, BLOOD: Lipase: 34 U/L (ref 11–51)

## 2023-12-29 NOTE — ED Provider Notes (Signed)
 [facility] Provider Note    Event Date/Time   First MD Initiated Contact with Patient 12/29/23 0315     (approximate)   History   Chief Complaint Flank Pain (left)   HPI  Ana Hudson is a 33 y.o. female with past medical history of chronic pelvic pain and endometriosis who presents to the ED complaining of flank pain.  Patient reports that she has been dealing with 2 days of intermittent pain in her left flank radiating towards her at the left lower quadrant of her abdomen.  She describes the pain as sharp and not exacerbated or alleviated by anything in particular.  She has been feeling nauseous at times but has not had any vomiting and denies any changes in her bowel movements.  She denies any fevers or dysuria, denies history of kidney stones.     Physical Exam   Triage Vital Signs: ED Triage Vitals  Encounter Vitals Group     BP 12/29/23 0230 122/88     Girls Systolic BP Percentile --      Girls Diastolic BP Percentile --      Boys Systolic BP Percentile --      Boys Diastolic BP Percentile --      Pulse Rate 12/29/23 0230 60     Resp 12/29/23 0230 16     Temp 12/29/23 0230 98.9 F (37.2 C)     Temp Source 12/29/23 0230 Oral     SpO2 12/29/23 0230 99 %     Weight --      Height 12/29/23 0233 5' 8 (1.727 m)     Head Circumference --      Peak Flow --      Pain Score 12/29/23 0233 7     Pain Loc --      Pain Education --      Exclude from Growth Chart --     Most recent vital signs: Vitals:   12/29/23 0230 12/29/23 0400  BP: 122/88 108/70  Pulse: 60   Resp: 16   Temp: 98.9 F (37.2 C)   SpO2: 99% 94%    Constitutional: Alert and oriented. Eyes: Conjunctivae are normal. Head: Atraumatic. Nose: No congestion/rhinnorhea. Mouth/Throat: Mucous membranes are moist.  Cardiovascular: Normal rate, regular rhythm. Grossly normal heart sounds.  2+ radial pulses bilaterally. Respiratory: Normal respiratory effort.  No retractions.  Lungs CTAB. Gastrointestinal: Soft and tender to palpation in the left lower quadrant with no rebound or guarding.  No CVA tenderness bilaterally. No distention. Musculoskeletal: No lower extremity tenderness nor edema.  Neurologic:  Normal speech and language. No gross focal neurologic deficits are appreciated.    ED Results / Procedures / Treatments   Labs (all labs ordered are listed, but only abnormal results are displayed) Labs Reviewed  URINALYSIS, ROUTINE W REFLEX MICROSCOPIC - Abnormal; Notable for the following components:      Result Value   Color, Urine YELLOW (*)    APPearance CLEAR (*)    All other components within normal limits  BASIC METABOLIC PANEL WITH GFR - Abnormal; Notable for the following components:   Glucose, Bld 103 (*)    All other components within normal limits  CBC  HEPATIC FUNCTION PANEL  LIPASE, BLOOD  POC URINE PREG, ED    RADIOLOGY CT renal protocol reviewed and interpreted by me with no hydronephrosis or kidney stone.  PROCEDURES:  Critical Care performed: No  Procedures   MEDICATIONS ORDERED IN ED: Medications - No data  to display   IMPRESSION / MDM / ASSESSMENT AND PLAN / ED COURSE  I reviewed the triage vital signs and the nursing notes.                              33 y.o. female with past medical history of chronic pelvic pain and endometriosis who presents to the ED complaining of left flank pain rating towards the left lower quadrant of her abdomen intermittently over the past 2 days.  Patient's presentation is most consistent with acute presentation with potential threat to life or bodily function.  Differential diagnosis includes, but is not limited to, kidney stone, pyelonephritis, ectopic pregnancy, ovarian cyst, ovarian torsion, colitis, diverticulitis.  Patient nontoxic-appearing and in no acute distress, vital signs are unremarkable.  Her abdomen is soft but she does have some tenderness in the left lower quadrant, no  CVA tenderness noted.  Pregnancy testing is negative and urinalysis shows no signs of infection.  Labs without significant anemia, leukocytosis, electrolyte abnormality, or AKI.  We will add on LFTs and lipase, further assess with CT renal protocol.  Patient declines pain or nausea medication at this time.  CT renal protocol is negative for acute process, LFTs and lipase also unremarkable.  Given reassuring workup, patient appropriate for discharge home with outpatient follow-up, was counseled to return to the ED for new or worsening symptoms.  Patient agrees with plan.      FINAL CLINICAL IMPRESSION(S) / ED DIAGNOSES   Final diagnoses:  Left flank pain     Rx / DC Orders   ED Discharge Orders     None        Note:  This document was prepared using Dragon voice recognition software and may include unintentional dictation errors.   Willo Dunnings, MD 12/29/23 0530

## 2023-12-29 NOTE — ED Triage Notes (Signed)
 Pt to ed from home via POV for flank pain x 2 days. Pt thought it was muscle pain at first but it hasn't gone away. Pt is caox4, in no acute distress and ambulatory in triage.
